# Patient Record
Sex: Female | Born: 1975 | Race: White | Hispanic: Yes | Smoking: Never smoker
Health system: Southern US, Community
[De-identification: ages and names within clinical notes are randomized; demographics above are authoritative.]

## PROBLEM LIST (undated history)

## (undated) DIAGNOSIS — Z789 Other specified health status: Secondary | ICD-10-CM

## (undated) HISTORY — PX: TUBAL LIGATION: SHX77

---

## 1998-02-09 ENCOUNTER — Inpatient Hospital Stay (HOSPITAL_COMMUNITY): Admission: AD | Admit: 1998-02-09 | Discharge: 1998-02-09 | Payer: Self-pay | Admitting: *Deleted

## 1998-04-22 ENCOUNTER — Other Ambulatory Visit: Admission: RE | Admit: 1998-04-22 | Discharge: 1998-04-22 | Payer: Self-pay | Admitting: Obstetrics and Gynecology

## 1998-09-22 ENCOUNTER — Inpatient Hospital Stay (HOSPITAL_COMMUNITY): Admission: AD | Admit: 1998-09-22 | Discharge: 1998-09-24 | Payer: Self-pay | Admitting: Obstetrics and Gynecology

## 2001-11-25 ENCOUNTER — Other Ambulatory Visit: Admission: RE | Admit: 2001-11-25 | Discharge: 2001-11-25 | Payer: Self-pay | Admitting: Obstetrics & Gynecology

## 2002-06-26 ENCOUNTER — Encounter: Payer: Self-pay | Admitting: *Deleted

## 2002-06-26 ENCOUNTER — Inpatient Hospital Stay (HOSPITAL_COMMUNITY): Admission: AD | Admit: 2002-06-26 | Discharge: 2002-06-26 | Payer: Self-pay | Admitting: *Deleted

## 2002-06-28 ENCOUNTER — Inpatient Hospital Stay (HOSPITAL_COMMUNITY): Admission: AD | Admit: 2002-06-28 | Discharge: 2002-07-01 | Payer: Self-pay | Admitting: Family Medicine

## 2002-07-03 ENCOUNTER — Inpatient Hospital Stay (HOSPITAL_COMMUNITY): Admission: AD | Admit: 2002-07-03 | Discharge: 2002-07-03 | Payer: Self-pay | Admitting: Family Medicine

## 2002-08-08 ENCOUNTER — Inpatient Hospital Stay (HOSPITAL_COMMUNITY): Admission: EM | Admit: 2002-08-08 | Discharge: 2002-08-13 | Payer: Self-pay | Admitting: Emergency Medicine

## 2002-08-19 ENCOUNTER — Encounter: Admission: RE | Admit: 2002-08-19 | Discharge: 2002-08-19 | Payer: Self-pay | Admitting: Internal Medicine

## 2004-03-20 ENCOUNTER — Inpatient Hospital Stay (HOSPITAL_COMMUNITY): Admission: AD | Admit: 2004-03-20 | Discharge: 2004-03-20 | Payer: Self-pay | Admitting: Obstetrics & Gynecology

## 2004-04-13 ENCOUNTER — Inpatient Hospital Stay (HOSPITAL_COMMUNITY): Admission: RE | Admit: 2004-04-13 | Discharge: 2004-04-16 | Payer: Self-pay | Admitting: Family Medicine

## 2004-04-20 ENCOUNTER — Inpatient Hospital Stay (HOSPITAL_COMMUNITY): Admission: AD | Admit: 2004-04-20 | Discharge: 2004-04-20 | Payer: Self-pay | Admitting: Obstetrics and Gynecology

## 2005-01-17 ENCOUNTER — Ambulatory Visit: Payer: Self-pay | Admitting: Internal Medicine

## 2005-02-01 ENCOUNTER — Ambulatory Visit: Payer: Self-pay | Admitting: Nurse Practitioner

## 2005-02-07 ENCOUNTER — Ambulatory Visit: Payer: Self-pay | Admitting: *Deleted

## 2005-02-20 ENCOUNTER — Ambulatory Visit: Payer: Self-pay | Admitting: Nurse Practitioner

## 2008-07-13 ENCOUNTER — Ambulatory Visit: Payer: Self-pay | Admitting: Family Medicine

## 2013-07-17 ENCOUNTER — Encounter: Payer: Self-pay | Admitting: *Deleted

## 2013-07-17 ENCOUNTER — Ambulatory Visit (INDEPENDENT_AMBULATORY_CARE_PROVIDER_SITE_OTHER): Payer: Self-pay | Admitting: *Deleted

## 2013-07-17 VITALS — BP 114/64 | Ht <= 58 in | Wt 115.0 lb

## 2013-07-17 DIAGNOSIS — Z3480 Encounter for supervision of other normal pregnancy, unspecified trimester: Secondary | ICD-10-CM

## 2013-07-17 DIAGNOSIS — Z3481 Encounter for supervision of other normal pregnancy, first trimester: Secondary | ICD-10-CM

## 2013-07-17 NOTE — Patient Instructions (Addendum)
Pregnancy If you are planning on getting pregnant, it is a good idea to make a preconception appointment with your caregiver to discuss having a healthy lifestyle before getting pregnant. This includes diet, weight, exercise, taking prenatal vitamins (especially folic acid, which helps prevent brain and spinal cord defects), avoiding alcohol, smoking and illegal drugs, medical problems (diabetes, convulsions), family history of genetic problems, working conditions, and immunizations. It is better to have knowledge of these things and do something about them before getting pregnant. During your pregnancy, it is important to follow certain guidelines in order to have a healthy baby. It is very important to get good prenatal care and follow your caregiver's instructions. Prenatal care includes all the medical care you receive before your baby's birth. This helps to prevent problems during the pregnancy and childbirth. HOME CARE INSTRUCTIONS   Start your prenatal visits by the 12th week of pregnancy or earlier, if possible. At first, appointments are usually scheduled monthly. They become more frequent in the last 2 months before delivery. It is important that you keep your caregiver's appointments and follow your caregiver's instructions regarding medication use, exercise, and diet.  During pregnancy, you are providing food for you and your baby. Eat a regular, well-balanced diet. Choose foods such as meat, fish, milk and other dairy products, vegetables, fruits, whole-grain breads and cereals. Your caregiver will inform you of the ideal weight gain depending on your current height and weight. Drink lots of liquids. Try to drink 8 glasses of water a day.  Alcohol is associated with a number of birth defects including fetal alcohol syndrome. It is best to avoid alcohol completely. Smoking will cause low birth rate and prematurity. Use of alcohol and nicotine during your pregnancy also increases the chances that  your child will be chemically dependent later in their life and may contribute to SIDS (Sudden Infant Death Syndrome).  Do not use illegal drugs.  Only take prescription or over-the-counter medications that are recommended by your caregiver. Other medications can cause genetic and physical problems in the baby.  Morning sickness can often be helped by keeping soda crackers at the bedside. Eat a few before getting up in the morning.  A sexual relationship may be continued until near the end of pregnancy if there are no other problems such as early (premature) leaking of amniotic fluid from the membranes, vaginal bleeding, painful intercourse or belly (abdominal) pain.  Exercise regularly. Check with your caregiver if you are unsure of the safety of some of your exercises.  Do not use hot tubs, steam rooms or saunas. These increase the risk of fainting and hurting yourself and the baby. Swimming is OK for exercise. Get plenty of rest, including afternoon naps when possible, especially in the third trimester.  Avoid toxic odors and chemicals.  Do not wear high heels. They may cause you to lose your balance and fall.  Do not lift over 5 pounds. If you do lift anything, lift with your legs and thighs, not your back.  Avoid long trips, especially in the third trimester.  If you have to travel out of the city or state, take a copy of your medical records with you. SEEK IMMEDIATE MEDICAL CARE IF:   You develop an unexplained oral temperature above 102 F (38.9 C), or as your caregiver suggests.  You have leaking of fluid from the vagina. If leaking membranes are suspected, take your temperature and inform your caregiver of this when you call.  There is vaginal spotting   or bleeding. Notify your caregiver of the amount and how many pads are used.  You continue to feel sick to your stomach (nauseous) and have no relief from remedies suggested, or you throw up (vomit) blood or coffee ground like  materials.  You develop upper abdominal pain.  You have round ligament discomfort in the lower abdominal area. This still must be evaluated by your caregiver.  You feel contractions of the uterus.  You do not feel the baby move, or there is less movement than before.  You have painful urination.  You have abnormal vaginal discharge.  You have persistent diarrhea.  You get a severe headache.  You have problems with your vision.  You develop muscle weakness.  You feel dizzy and faint.  You develop shortness of breath.  You develop chest pain.  You have back pain that travels down to your leg and feet.  You feel irregular or a very fast heartbeat.  You develop excessive weight gain in a short period of time (5 pounds in 3 to 5 days).  You are involved in a domestic violence situation. Document Released: 10/30/2005 Document Revised: 04/30/2012 Document Reviewed: 04/23/2009 [facility] Patient Information 2014 [place], [place]. Embarazo  Primer trimestre  (Pregnancy - First Trimester)  Durante el acto sexual, millones de espermatozoides entran en la vagina. Slo 1 espermatozoide penetra y fertiliza al vulo mientras se encuentra en la trompa de [place]. Una semana ms tarde, el vulo fertilizado se implanta en la pared del tero. Un embrin comienza a desarrollarse para ser un beb. A las 6 a 8 semanas se forman los ojos y la cara y los latidos del corazn se pueden ver en la ecografa. Al final de las 12 semanas (primer trimestre) todos los rganos del beb estn formados. Ahora que est embarazada, querr hacer todo lo que est a su alcance para tener un beb sano. Dos de las cosas ms importantes son: Winferd Humphrey buena atencin prenatal y seguir las indicaciones del profesional que la asiste. La atencin prenatal incluye toda la asistencia mdica que usted recibe antes del nacimiento del beb. Se lleva a cabo para prevenir y tratar problemas durante el [address] y [place]. EXAMENES  PRENATALES   Durante las visitas prenatales se [employer], la presin arterial y se solicitan anlisis de [place]. Esto se hace para asegurarse de que usted est sana y el embarazo progrese normalmente.  Una mujer embarazada debe aumentar de 25 a 35 libras durante el embarazo. Sin embargo, si usted tiene sobrepeso o [place], su mdico Administrator, Civil Service.  El podr hacerle preguntas y responder todas las que usted le haga.  Durante los exmenes prenatales se solicitan anlisis de [place], cultivos del [place], un [place] y otros anlisis necesarios. Estas pruebas se realizan para controlar su salud y la del beb. Se recomienda que se haga la prueba para el diagnstico del VIH, con su autorizacin. Este es el virus que causa el [place]. Estas pruebas se realizan porque existen medicamentos que podran administrarle para prevenir que el beb nazca con esta infeccin, si usted estuviera infectada y no lo supiera. Los [employer] de sangre tambin se Radiographer, therapeutic para Warehouse manager tipo de [place], si tuvo infecciones previas y Chief Operating Officer sus niveles en la sangre (hemoglobina).  Tener un recuento bajo de hemoglobina (anemia) es comn durante Firefighter. Para prevenirla, se administran hierro y vitaminas. En una etapa ms avanzada del embarazo, le indicarn exmenes de sangre para saber si tiene diabetes, junto con otros  anlisis, en caso de que [place].  Es necesario que se haga las pruebas para asegurarse de que usted y el beb estn bien. CAMBIOS DURANTE EL PRIMER TRIMESTRE  Su organismo atravesar numerosos cambios durante el [place]. Estos pueden variar de Neomia Dear persona a otra. Converse con el mdico acerca los cambios que usted nota y que la preocupan. Ellos son:   El perodo menstrual se detiene.  El vulo y los espermatozoides llevan los genes que determinan cmo seremos. Sus genes y los de su pareja forman el beb. Los genes del varn determinan si ser un nio o una nia.  La  circunferencia de la cintura va a ir aumentando y podr sentirse hinchada.  Puede tener Programme researcher, broadcasting/film/video (nuseas) y vmitos. Si no puede controlar los vmitos, consulte a su mdico.  Sus mamas comenzarn a agrandarse y sensibilizarse.  Los pezones pueden sobresalir ms y ser ms oscuros.  Tendr necesidad de orinar ms. El dolor al orinar puede significar que usted tiene una infeccin de la vejiga.  Se cansar con facilidad.  Prdida del apetito.  Sentir un fuerte deseo de consumir ciertos alimentos.  Al principio, usted puede ganar o perder un par de kilos.  Podr tener cambios emocionales de un da a otro (entusiasmo por estar embarazada o preocupacin por Firefighter y el beb).  Tendr sueos ms vvidos y extraos. INSTRUCCIONES PARA EL CUIDADO EN EL HOGAR   Es muy importante evitar el cigarrillo, el alcohol y los frmacos no recetados Academic librarian. Estas sustancias afectan la formacin y el desarrollo del beb. Evite los productos qumicos durante el embarazo para Games developer salud del beb.  Comience las consultas prenatales alrededor de la 12 semana de [place]. Generalmente se programan cada mes al principio y se hacen ms frecuentes en los 2 ltimos meses antes del parto. Cumpla con las citas de control. Siga las indicaciones del mdico con respecto al uso de [place], los anlisis y pruebas de [place], los ejercicios y Psychologist, forensic.  Durante el embarazo debe obtener nutrientes para usted y para su beb. Consuma alimentos balanceados. Elija alimentos como carne, pescado, [place] y otros productos lcteos descremados, vegetales, frutas, panes integrales y cereales. El [employer] informar cul es el aumento de peso ideal.  Las nuseas matinales pueden aliviarse si come algunas galletitas saladas en la cama. Coma dos galletitas antes de levantarse por la maana. Tambin puede comer galletitas sin sal.  Hacer 4 o 5 comidas pequeas en lugar de 3 comidas grandes por  da tambin puede [employer] nuseas y los vmitos.  Beber lquidos [place] comidas en lugar de tomarlos durante las comidas tambin puede ayudar a Optician, dispensing las nuseas y los vmitos.  Puede continuar teniendo [employer] durante todo el embarazo si no hay otros problemas. Los problemas pueden ser una prdida precoz (prematura) de lquido amnitico, sangrado vaginal, o dolor en el vientre (abdominal).  Realice [employer], si no tiene restricciones. Consulte con su mdico o terapeuta fsico si no est segura de algunos de sus ejercicios. El mayor aumento de peso se producir en los ltimos 2 trimestres del Psychiatrist. El ejercicio le ayudar a:  Engineering geologist.  Mantenerse en forma.  Prepararse para el parto.  La ayudar a perder el peso del embarazo despus de que nazca su beb.  Use un buen sostn o como los que se usan para hacer deportes para Paramedic la sensibilidad de las [place]. Tambin puede serle til si lo [place] mientras  duerme.  Consulte cuando puede comenzar con las clases de pre parto. Comience con las clases cuando estn disponibles.  No utilice la baera con agua caliente, baos turcos y saunas.   Colquese el cinturn de seguridad cuando conduzca. Este la proteger a usted y al beb en caso de accidente.  Evite comer carne cruda y el contacto con los utensilios y desperdicios de los gatos. Estos elementos contienen grmenes que pueden causar defectos de nacimiento en el beb.  El primer trimestre es un buen momento para visitar a su dentista y Software engineer. Es importante mantener los dientes limpios. Use un cepillo de dientes suave y cepllese con ms suavidad durante el [employer].  Pida ayuda si tienen necesidades financieras, teraputicas o nutricionales. El profesional podr ayudarla con respecto a estas necesidades, o derivarla a otros especialistas.  No tome medicamentos o hierbas excepto aquellos que Print production planner.  Informe a su mdico si sufre violencia familiar mental o fsica.  Haga una lista de nmeros de telfono de Associate Professor de la familia, los amigos, el hospital y los departamentos de polica y bomberos.  Escriba sus preguntas. Llvelas cuando concurra a su visita prenatal.  No se haga duchas vaginales.  No cruce las piernas.  Si usted tiene que estar parada por largos perodos de [place], gire los pies o de pequeos pasos en crculo.  Es posible que tenga ms secreciones vaginales que puedan requerir una toalla higinica. No use tampones o toallas higinicas perfumadas. EL CONSUMO DE MEDICAMENTOS Y FRMACOS DURANTE EL EMBARAZO   Tome las vitaminas para la etapa prenatal tal como se le indic. Las vitaminas deben contener un miligramo de cido flico. Guarde todas las vitaminas fuera del alcance de los nios. La ingestin de slo un par de vitaminas o tabletas que contengan hierro pueden ocasionar la [employer] en un beb o en un nio pequeo.  Evite el uso de [employer] Mutual [employer] Omaha, incluyendo hierbas, medicamentos de [place], sin receta o que no hayan sido sugeridos por su mdico. Slo tome medicamentos de venta libre o medicamentos recetados para Chief Technology Officer, Environmental health practitioner o fiebre como lo indique su mdico. No tome aspirina, ibuprofeno o naproxeno excepto que su mdico se lo indique.  Infrmele al profesional si consume medicamentos de hierbas.  El alcohol se relaciona con ciertos defectos congnitos. Incluye el sndrome de alcoholismo fetal. Debe evitar absolutamente el consumo de alcohol, en cualquier forma. El fumar causar baja tasa de natalidad y bebs prematuros.  Las drogas ilegales o de la calle son muy perjudiciales para el beb. Estn absolutamente prohibidas. Un beb que nace de [employer], ser adicto al nacer. Ese beb tendr los mismos sntomas de abstinencia que un adulto.  Informe a su mdico acerca de los medicamentos que ha tomado y el motivo por el que los  tom. SOLICITE ATENCIN MDICA SI:  Tiene preguntas o preocupaciones relacionadas con el embarazo. Es mejor que llame para formular las preguntas si no puede esperar hasta la prxima visita, que sentirse preocupada por ellas.  SOLICITE ATENCIN MDICA DE INMEDIATO SI:   La temperatura oral le sube a ms de 102 F (38.9 C) o lo que su mdico le indique.  Tiene una prdida de lquido por la vagina (canal de parto). Si sospecha una ruptura de las [place], tmese la temperatura y llame al profesional para informarlo sobre esto.  Observa unas pequeas manchas o una hemorragia vaginal. Notifique al profesional acerca de la cantidad y de cuntos apsitos  est utilizando.  Presenta un olor desagradable en la secrecin vaginal y observa un cambio en el color.  Contina con las nuseas y no obtiene alivio de los remedios indicados. Vomita sangre o algo similar a la borra del caf.  Pierde ms de 2 libras (1 Kg) en una semana.  Aumenta ms de 1 Kg en una semana y [address], las manos, los pies o las piernas hinchados.  Aumenta ms de 2,5 Kg en una semana (aunque no tenga las manos, pies, piernas o el rostro hinchados).  Ha estado expuesta a la rubola y no ha sufrido la enfermedad.  Ha estado expuesta a la quinta enfermedad o a la varicela.  Siente dolor en el vientre (abdominal). Las [employer] en el ligamento redondo son Neomia Dear causa benigna frecuente de dolor abdominal durante el embarazo. El profesional que la asiste deber evaluarla.  Presenta dolor de Renne Musca, diarrea, dolor al orinar o le falta la respiracin.  Se cae, se ve involucrada en un accidente automovilstico o sufre algn tipo de traumatismo.  En su hogar hay violencia mental o fsica. Document Released: 08/09/2005 Document Revised: 07/24/2012 [facility] Patient Information 2014 [place], [place].

## 2013-07-18 LAB — OBSTETRIC PANEL
Antibody Screen: NEGATIVE
Basophils Absolute: 0 10*3/uL (ref 0.0–0.1)
Basophils Relative: 0 % (ref 0–1)
Eosinophils Absolute: 0.1 10*3/uL (ref 0.0–0.7)
Eosinophils Relative: 1 % (ref 0–5)
HCT: 36.6 % (ref 36.0–46.0)
Hemoglobin: 12.8 g/dL (ref 12.0–15.0)
Hepatitis B Surface Ag: NEGATIVE
Lymphocytes Relative: 21 % (ref 12–46)
Lymphs Abs: 2 10*3/uL (ref 0.7–4.0)
MCH: 31.8 pg (ref 26.0–34.0)
MCHC: 35 g/dL (ref 30.0–36.0)
Monocytes Absolute: 0.5 10*3/uL (ref 0.1–1.0)
Monocytes Relative: 6 % (ref 3–12)
Neutro Abs: 6.9 10*3/uL (ref 1.7–7.7)
Neutrophils Relative %: 72 % (ref 43–77)
Platelets: 220 10*3/uL (ref 150–400)
RBC: 4.03 MIL/uL (ref 3.87–5.11)
RDW: 13.6 % (ref 11.5–15.5)
Rh Type: POSITIVE
Rubella: 2.88 Index — ABNORMAL HIGH (ref <0.90)
WBC: 9.5 10*3/uL (ref 4.0–10.5)

## 2013-07-18 LAB — CULTURE, OB URINE
Colony Count: NO GROWTH
Organism ID, Bacteria: NO GROWTH

## 2013-07-18 LAB — HIV ANTIBODY (ROUTINE TESTING W REFLEX): HIV: NONREACTIVE

## 2013-07-29 ENCOUNTER — Encounter: Payer: Self-pay | Admitting: Obstetrics & Gynecology

## 2013-07-29 ENCOUNTER — Ambulatory Visit (INDEPENDENT_AMBULATORY_CARE_PROVIDER_SITE_OTHER): Payer: Self-pay | Admitting: Obstetrics & Gynecology

## 2013-07-29 VITALS — BP 109/65 | Wt 113.0 lb

## 2013-07-29 DIAGNOSIS — O09529 Supervision of elderly multigravida, unspecified trimester: Secondary | ICD-10-CM

## 2013-07-29 DIAGNOSIS — Z3491 Encounter for supervision of normal pregnancy, unspecified, first trimester: Secondary | ICD-10-CM

## 2013-07-29 DIAGNOSIS — IMO0002 Reserved for concepts with insufficient information to code with codable children: Secondary | ICD-10-CM | POA: Insufficient documentation

## 2013-07-29 DIAGNOSIS — Z3490 Encounter for supervision of normal pregnancy, unspecified, unspecified trimester: Secondary | ICD-10-CM | POA: Insufficient documentation

## 2013-07-29 DIAGNOSIS — Z124 Encounter for screening for malignant neoplasm of cervix: Secondary | ICD-10-CM

## 2013-07-29 DIAGNOSIS — Z3480 Encounter for supervision of other normal pregnancy, unspecified trimester: Secondary | ICD-10-CM

## 2013-07-29 DIAGNOSIS — O34219 Maternal care for unspecified type scar from previous cesarean delivery: Secondary | ICD-10-CM

## 2013-07-29 DIAGNOSIS — Z789 Other specified health status: Secondary | ICD-10-CM

## 2013-07-29 DIAGNOSIS — Z113 Encounter for screening for infections with a predominantly sexual mode of transmission: Secondary | ICD-10-CM

## 2013-07-29 DIAGNOSIS — O09521 Supervision of elderly multigravida, first trimester: Secondary | ICD-10-CM

## 2013-07-29 DIAGNOSIS — Z302 Encounter for sterilization: Secondary | ICD-10-CM | POA: Insufficient documentation

## 2013-07-29 DIAGNOSIS — Z609 Problem related to social environment, unspecified: Secondary | ICD-10-CM

## 2013-07-29 DIAGNOSIS — Z1151 Encounter for screening for human papillomavirus (HPV): Secondary | ICD-10-CM

## 2013-07-29 NOTE — Progress Notes (Signed)
[id number] 

## 2013-07-29 NOTE — Patient Instructions (Signed)
Embarazo  Primer trimestre  (Pregnancy - First Trimester)  Durante el acto sexual, millones de espermatozoides entran en la vagina. Slo 1 espermatozoide penetra y fertiliza al vulo mientras se encuentra en la trompa de [place]. Una semana ms tarde, el vulo fertilizado se implanta en la pared del tero. Un embrin comienza a desarrollarse para ser un beb. A las 6 a 8 semanas se forman los ojos y la cara y los latidos del corazn se pueden ver en la ecografa. Al final de las 12 semanas (primer trimestre) todos los rganos del beb estn formados. Ahora que est embarazada, querr hacer todo lo que est a su alcance para tener un beb sano. Dos de las cosas ms importantes son: Lucilla Edin buena atencin prenatal y seguir las indicaciones del profesional que la asiste. La atencin prenatal incluye toda la asistencia mdica que usted recibe antes del nacimiento del beb. Se lleva a cabo para prevenir y tratar problemas durante el embarazo y [place]. [place] visitas prenatales se ONEOK, la presin arterial y se solicitan anlisis de [place]. Esto se hace para asegurarse de que usted est sana y el embarazo progrese normalmente.  Una mujer Adelino 25 a [place]. Sin embargo, si usted tiene sobrepeso o [place], su mdico Chartered certified accountant.  El podr hacerle preguntas y responder todas las que usted le haga.  Durante los exmenes prenatales se solicitan anlisis de [place], cultivos del [place], un [place] y otros anlisis necesarios. Estas pruebas se realizan para controlar su salud y la del beb. Se recomienda que se haga la prueba para el diagnstico del VIH, con su autorizacin. Este es el virus que causa el [place]. Estas pruebas se realizan porque existen medicamentos que podran administrarle para prevenir que el beb nazca con esta infeccin, si usted estuviera infectada y no lo supiera. Los [employer] de sangre  tambin se Insurance underwriter para Actor tipo de [place], si tuvo infecciones previas y Chief Technology Officer sus niveles en la sangre (hemoglobina).  Tener un recuento bajo de hemoglobina (anemia) es comn durante Water quality scientist. Para prevenirla, se administran hierro y vitaminas. En una etapa ms avanzada del [place], le indicarn exmenes de sangre para saber si tiene diabetes, junto con otros anlisis, en caso de que [place].  Es necesario que se haga las pruebas para asegurarse de que usted y el beb estn bien. CAMBIOS DURANTE EL PRIMER TRIMESTRE  Su organismo atravesar numerosos cambios durante el [place]. Estos pueden variar de Ardelia Mems persona a otra. Converse con el mdico acerca los cambios que usted nota y que la preocupan. Ellos son:   El perodo menstrual se detiene.  El vulo y los espermatozoides llevan los genes que determinan cmo seremos. Sus genes y los de su pareja forman el beb. Los genes del varn determinan si ser un nio o una nia.  La circunferencia de la cintura va a ir aumentando y podr sentirse hinchada.  Puede tener Higher education careers adviser (nuseas) y vmitos. Si no puede controlar los vmitos, consulte a su mdico.  Sus mamas comenzarn a agrandarse y sensibilizarse.  Los pezones pueden sobresalir ms y ser ms oscuros.  Tendr necesidad de orinar ms. El dolor al orinar puede significar que usted tiene una infeccin de la vejiga.  Se cansar con facilidad.  Prdida del apetito.  Sentir un fuerte deseo de consumir ciertos alimentos.  Al principio, usted puede ganar o perder un par de kilos.  Podr tener cambios emocionales de un da a otro (entusiasmo por estar embarazada o preocupacin por Water quality scientist y el beb).  Tendr sueos ms vvidos y extraos. INSTRUCCIONES PARA EL CUIDADO EN EL HOGAR   Es muy importante evitar el cigarrillo, el alcohol y los frmacos no recetados Solicitor. Estas sustancias afectan la formacin y el desarrollo del beb.  Evite los productos qumicos durante el embarazo para Tourist information centre manager salud del beb.  Comience las consultas prenatales alrededor de la 12 semana de [place]. Generalmente se programan cada mes al principio y se hacen ms frecuentes en los 2 ltimos meses antes del parto. Cumpla con las citas de control. Siga las indicaciones del mdico con respecto al uso de [place], los anlisis y pruebas de [place], los ejercicios y Engineer, technical sales.  Durante el embarazo debe obtener nutrientes para usted y para su beb. Consuma alimentos balanceados. Elija alimentos como carne, pescado, [place] y otros productos lcteos descremados, vegetales, frutas, panes integrales y cereales. El V[employer]om informar cul es el aumento de peso ideal.  Las nuseas matinales pueden aliviarse si come algunas galletitas saladas en la cama. Coma dos galletitas antes de levantarse por la maana. Tambin puede comer galletitas sin sal.  Hacer 4 o 5 comidas pequeas en lugar de 3 comidas grandes por da tambin puede [employer] nuseas y los vmitos.  Beber lquidos [employer] comidas en lugar de tomarlos durante las comidas tambin puede ayudar a Actor las nuseas y los vmitos.  Puede continuar teniendo [employer] durante todo el embarazo si no hay otros problemas. Los problemas pueden ser una prdida precoz (prematura) de lquido amnitico, sangrado vaginal, o dolor en el vientre (abdominal).  [place], si no tiene restricciones. Consulte con su mdico o terapeuta fsico si no est segura de algunos de sus ejercicios. El mayor aumento de peso se producir en los ltimos 2 trimestres del Media planner. El ejercicio le ayudar a:  Engineer, technical sales.  Mantenerse en forma.  Prepararse para el parto.  La ayudar a perder el peso del embarazo despus de que nazca su beb.  Use un buen sostn o como los que se usan para hacer deportes para Public house manager la sensibilidad de las [place]. Tambin puede serle  til si lo [place] mientras duerme.  Consulte cuando puede comenzar con las clases de pre parto. Comience con las clases cuando estn disponibles.  No utilice la baera con agua caliente, baos turcos y saunas.   Colquese el cinturn de seguridad cuando conduzca. Este la proteger a usted y al beb en caso de accidente.  Evite comer carne cruda y el contacto con los utensilios y desperdicios de los gatos. Estos elementos contienen grmenes que pueden causar defectos de nacimiento en el beb.  El primer trimestre es un buen momento para visitar a su dentista y Leisure centre manager. Es importante mantener los dientes limpios. Use un cepillo de dientes suave y cepllese con ms suavidad durante el [employer].  Pida ayuda si tienen necesidades financieras, teraputicas o nutricionales. El profesional podr ayudarla con respecto a estas necesidades, o derivarla a otros especialistas.  No tome medicamentos o hierbas excepto aquellos que Firefighter.  Informe a su mdico si sufre violencia familiar mental o fsica.  Haga una lista de nmeros de telfono de Freight forwarder de la familia, los amigos, el hospital y los departamentos de polica y bomberos.  Escriba sus preguntas. Llvelas cuando concurra a su visita prenatal.  No se  haga duchas vaginales.  No cruce las piernas.  Si usted tiene que estar parada por largos perodos de [place], gire los pies o de pequeos pasos en crculo.  Es posible que tenga ms secreciones vaginales que puedan requerir una toalla higinica. No use tampones o toallas higinicas perfumadas. EL CONSUMO DE MEDICAMENTOS Y FRMACOS DURANTE EL EMBARAZO   Tome las vitaminas para la etapa prenatal tal como se le indic. Las vitaminas deben contener un miligramo de cido flico. Guarde todas las vitaminas fuera del alcance de los nios. La ingestin de slo un par de vitaminas o tabletas que contengan hierro pueden ocasionar la [employer] en un beb o en un nio  pequeo.  Evite el uso de [employer] Mutual [employer] Omaha, incluyendo hierbas, medicamentos de [place], sin receta o que no hayan sido sugeridos por su mdico. Slo tome medicamentos de venta libre o medicamentos recetados para Chief Technology Officer, Environmental health practitioner o fiebre como lo indique su mdico. No tome aspirina, ibuprofeno o naproxeno excepto que su mdico se lo indique.  Infrmele al profesional si consume medicamentos de hierbas.  El alcohol se relaciona con ciertos defectos congnitos. Incluye el sndrome de alcoholismo fetal. Debe evitar absolutamente el consumo de alcohol, en cualquier forma. El fumar causar baja tasa de natalidad y bebs prematuros.  Las drogas ilegales o de la calle son muy perjudiciales para el beb. Estn absolutamente prohibidas. Un beb que nace de [employer], ser adicto al nacer. Ese beb tendr los mismos sntomas de abstinencia que un adulto.  Informe a su mdico acerca de los medicamentos que ha tomado y el motivo por el que los tom. SOLICITE ATENCIN MDICA SI:  Tiene preguntas o preocupaciones relacionadas con el embarazo. Es mejor que llame para formular las preguntas si no puede esperar hasta la prxima visita, que sentirse preocupada por ellas.  SOLICITE ATENCIN MDICA DE INMEDIATO SI:   La temperatura oral le sube a ms de 102 F (38.9 C) o lo que su mdico le indique.  Tiene una prdida de lquido por la vagina (canal de parto). Si sospecha una ruptura de las [place], tmese la temperatura y llame al profesional para informarlo sobre esto.  Observa unas pequeas manchas o una hemorragia vaginal. Notifique al profesional acerca de la cantidad y de cuntos apsitos est utilizando.  Presenta un olor desagradable en la secrecin vaginal y observa un cambio en el color.  Contina con las nuseas y no obtiene alivio de los remedios indicados. Vomita sangre o algo similar a la borra del caf.  Pierde ms de 2 libras (1 Kg) en una semana.  Aumenta ms de 1 Kg  en una semana y [address], las manos, los pies o las piernas hinchados.  Aumenta ms de 2,5 Kg en una semana (aunque no tenga las manos, pies, piernas o el rostro hinchados).  Ha estado expuesta a la rubola y no ha sufrido la enfermedad.  Ha estado expuesta a la quinta enfermedad o a la varicela.  Siente dolor en el vientre (abdominal). Las [employer] en el ligamento redondo son Neomia Dear causa benigna frecuente de dolor abdominal durante el embarazo. El profesional que la asiste deber evaluarla.  Presenta dolor de Renne Musca, diarrea, dolor al orinar o le falta la respiracin.  Se cae, se ve involucrada en un accidente automovilstico o sufre algn tipo de traumatismo.  En su hogar hay violencia mental o fsica. Document Released: 08/09/2005 Document Revised: 07/24/2012 [facility] Patient Information 2014 [place], [place].

## 2013-07-29 NOTE — Progress Notes (Signed)
  Subjective:    Kara Hudson is a [id number] at [redacted]w[redacted]d by LMP being seen today for her first obstetrical visit.  Her obstetrical history is significant for advanced maternal age and cesarean section x 2. Patient does intend to breast feed. Pregnancy history fully reviewed.  Patient reports no complaints. She is an [facility] patient. Patient is Spanish-speaking only, Spanish interpreter present for this encounter.   Filed Vitals:   07/29/13 1430  BP: 109/65  Weight: 113 lb (51.256 kg)    HISTORY: OB History  Gravida Para Term Preterm AB SAB TAB Ectopic Multiple Living  4 3 3  0 0 0 0 0 0 3    # Outcome Date GA Lbr Len/2nd Weight Sex Delivery Anes PTL Lv  4 CUR           3 TRM 2005 [redacted]w[redacted]d  7 lb (3.175 kg) M LTCS EPI N Y     Comments: breech c-section  2 TRM 2003 [redacted]w[redacted]d  7 lb (3.175 kg) M LTCS EPI N Y     Comments: Tried vaginal but did not progess so had c section  1 TRM 1999 [redacted]w[redacted]d  6 lb 6 oz (2.892 kg) M SVD EPI N Y     No past medical history on file. Past Surgical History  Procedure Laterality Date  . Cesarean section     Family History  Problem Relation Age of Onset  . Hypertension Mother   . Hypertension Son      Exam    Uterus:     Pelvic Exam:    Perineum: No Hemorrhoids, normal perineum   Vulva: normal   Vagina:  normal mucosa, normal discharge   Cervix: no bleeding following Pap   Adnexa: normal adnexa and no mass, fullness, tenderness   Bony Pelvis: gynecoid  System: Breast:  normal appearance, no masses or tenderness   Skin: normal coloration and turgor, no rashes   Neurologic: normal   Extremities: normal strength, tone, and muscle mass   HEENT PERRLA   Mouth/Teeth mucous membranes moist, pharynx normal without lesions and dental hygiene good   Neck supple and no masses   Cardiovascular: regular rate and rhythm   Respiratory:  appears well, vitals normal, no respiratory distress, acyanotic, normal RR, chest clear, no wheezing, crepitations, rhonchi,  normal symmetric air entry   Abdomen: soft, non-tender; bowel sounds normal; no masses,  no organomegaly   Urinary: urethral meatus normal   Bedside ultrasound: CRL gives GA of [redacted]w[redacted]d consistent with LMP, +FHR   Assessment:    Pregnancy: [id number] Patient Active Problem List   Diagnosis Date Noted  . Supervision of normal pregnancy 07/29/2013  . Advanced maternal age, antepartum 07/29/2013  . Previous cesarean section x 2, antepartum  07/29/2013  . Language barrier, speaks Spanish only 07/29/2013  . Desires Sterilization 07/29/2013   Plan:   Initial labs reviewed and are normal Continue prenatal vitamins. Problem list reviewed and updated. Patient desires repeat cesarean section and BTS Genetic Screening discussed, emphasized increased risk of anomalies due to AMA, patient declines due to cost. Ultrasound discussed; fetal survey: will be ordered at Dr. Elsie Stain office. Follow up in 4 weeks.  Jaynie Collins, MD, FACOG Attending Obstetrician & Gynecologist Faculty Practice, [facility] [place]

## 2013-08-19 ENCOUNTER — Ambulatory Visit (INDEPENDENT_AMBULATORY_CARE_PROVIDER_SITE_OTHER): Payer: Self-pay | Admitting: Obstetrics & Gynecology

## 2013-08-19 ENCOUNTER — Encounter: Payer: Self-pay | Admitting: Obstetrics & Gynecology

## 2013-08-19 VITALS — BP 113/74 | Wt 114.0 lb

## 2013-08-19 DIAGNOSIS — O09529 Supervision of elderly multigravida, unspecified trimester: Secondary | ICD-10-CM

## 2013-08-19 DIAGNOSIS — Z3490 Encounter for supervision of normal pregnancy, unspecified, unspecified trimester: Secondary | ICD-10-CM

## 2013-08-19 DIAGNOSIS — Z3480 Encounter for supervision of other normal pregnancy, unspecified trimester: Secondary | ICD-10-CM

## 2013-08-19 NOTE — Progress Notes (Signed)
Routine visit. No problems. [facility] She declines to have it here today. She again declines genetic testing.

## 2013-08-19 NOTE — Progress Notes (Signed)
[id number] 

## 2013-09-15 ENCOUNTER — Encounter: Payer: Self-pay | Admitting: Family Medicine

## 2013-09-15 ENCOUNTER — Ambulatory Visit (INDEPENDENT_AMBULATORY_CARE_PROVIDER_SITE_OTHER): Payer: Self-pay | Admitting: Family Medicine

## 2013-09-15 VITALS — BP 118/63 | Wt 119.0 lb

## 2013-09-15 DIAGNOSIS — Z3492 Encounter for supervision of normal pregnancy, unspecified, second trimester: Secondary | ICD-10-CM

## 2013-09-15 DIAGNOSIS — Z3480 Encounter for supervision of other normal pregnancy, unspecified trimester: Secondary | ICD-10-CM

## 2013-09-15 NOTE — Progress Notes (Signed)
Ordered anatomy u/s with Dr. Gaynell Face. Concerned about one breast larger than another in breast--but no masses noted.  Retracted nipple on left--not new and easily brought out. Recommend flu shot Interested in TOLAC with 1 prev. SVD--discussed risks. Information given.

## 2013-09-15 NOTE — Patient Instructions (Addendum)
Lactancia materna  (Breastfeeding)  El cambio hormonal durante el Psychiatrist produce el desarrollo del tejido [place] y un aumento en el nmero y tamao de los conductos galactforos. La hormona prolactina permite que las protenas, los azcares y las grasas de la sangre produzcan la [employer] materna en las glndulas productoras de [place]. La hormona progesterona impide que la leche materna sea liberada antes del nacimiento del beb. Despus del nacimiento del beb, su nivel de progesterona disminuye permitiendo que la leche materna sea [place]. Pensar en el beb, as como la succin o Theatre manager, pueden estimular la liberacin de [place] de las glndulas productoras de [place].  La decisin de Company secretary) es una de las mejores opciones que usted puede hacer para usted y su beb. La informacin que sigue da una breve resea de los beneficios, as Lexicographer que debe saber sobre la [place].  LOS BENEFICIOS DE AMAMANTAR  Para el beb   La primera leche (calostro) ayuda al mejor funcionamiento del sistema digestivo del beb.   La leche tiene anticuerpos que provienen de la madre y que ayudan a prevenir las infecciones en el beb.   El beb tiene una menor incidencia de asma, alergias y del sndrome de muerte sbita del lactante (SMSL).   Los nutrientes de la [place] materna son mejores para el beb que la [employer].  La leche materna mejora el desarrollo cerebral del beb.   Su beb tendr menos gases, clicos y estreimiento.  Es menos probable que el beb desarrolle otras enfermedades, como obesidad infantil, asma o diabetes mellitus. Para usted   La lactancia materna favorece el desarrollo de un vnculo muy especial entre la madre y el beb.   Es ms conveniente, siempre disponible, a la [place] y [place].   La lactancia materna ayuda a quemar caloras y a perder el peso ganado durante el [place].   Hace que el tero se  contraiga ms rpidamente a su tamao normal y [employer] sangrado despus del [place].   Las [employer] que amamantan tienen menos riesgo de Environmental education officer osteoporosis o cncer de mama o de ovario en el futuro.  FRECUENCIA DEL AMAMANTAMIENTO   Un beb sano, nacido a trmino, puede amamantarse con tanta frecuencia como cada hora, o espaciar las comidas cada tres horas. La frecuencia en la lactancia varan de un beb a otro.   Los recin nacidos deben ser alimentados por lo menos cada 2-3 horas Administrator y cada 4-5 horas durante la noche. Usted debe amamantarlo un mnimo de 8 tomas en un perodo de 24 horas.  Despierte al beb para amamantarlo si han pasado 3-4 horas desde la ltima comida.  Amamante cuando sienta la necesidad de reducir la plenitud de sus senos o cuando el beb muestre signos de [place]. Las seales de que el beb puede Gentry Fitz son:  Lenora Boys su estado de alerta o vigilancia.  Se estira.  Mueve la cabeza de un lado a otro.  Mueve la cabeza y abre la boca cuando se le toca la mejilla o la boca (reflejo de succin).  Aumenta las vocalizaciones, tales como sonidos de succin, relamerse los labios, arrullos, suspiros, o chirridos.  Mueve la [employer] boca.  Se chupa con ganas los dedos o las manos.  Agitacin.  Llanto intermitente.  Los signos de hambre extrema requerirn que lo calme y lo consuele antes de tratar de alimentarlo. Los signos de hambre extrema son:  Agitacin.  Llanto fuerte e intenso.  Gritos.  El amamantamiento frecuente la ayudar a producir ms [place] y a Education officer, community de Engineer, mining en los pezones e hinchazn de las [place].  LACTANCIA MATERNA   Ya sea que se encuentre acostada o sentada, asegrese que el abdomen del beb est enfrente el suyo.   Sostenga la mama con el pulgar por arriba y los otros 4 dedos por debajo del pezn. Asegrese que sus dedos se encuentren lejos del pezn y de la boca del beb.   Empuje suavemente los  labios del beb con el pezn o con el dedo.   Cuando la boca del beb se abra lo suficiente, introduzca el pezn y la zona oscura que lo rodea (areola) tanto como le sea posible dentro de la boca.  Debe haber ms areola visible por arriba del labio superior que por debajo del labio inferior.  La lengua del beb debe estar entre la enca inferior y el seno.  Asegrese de que la boca del beb est en la posicin correcta alrededor del pezn (prendida). Los labios del beb deben crear un sello sobre su pecho.  Las seales de que el beb se ha prendido eficazmente al pezn son:  Payton Doughty o succiona sin dolor.  Se escucha que traga [employer].  No hace ruidos ni chasquidos.  Hay movimientos musculares por arriba y por delante de sus odos al Printmaker.  El beb debe succionar unos 2-3 minutos para que salga la [place]. Permita que el nio se alimente en cada mama todo lo que desee. Alimente al beb hasta que se desprenda o se quede dormido en Freight forwarder y luego ofrzcale el segundo pecho.  Las seales de que el beb est lleno y satisfecho son:  Disminuye gradualmente el nmero de succiones o no succiona.  Se queda dormido.  Extiende o relaja su cuerpo.  Retiene una pequea cantidad de [facility] boca.  Se desprende del pecho por s mismo.  Los signos de una lactancia materna eficaz son:  Los senos han aumentado la firmeza, el peso y el tamao antes de la alimentacin.  Son ms blandos despus de amamantar.  Un aumento del volumen de [place], y tambin el cambio de su consistencia y color se producen hacia el quinto da de Tour manager.  La congestin mamaria se [place] al dar de [place].  Los pezones no duelen, ni estn agrietados ni sangran.  De ser necesario, interrumpa la succin poniendo su dedo en la esquina de la boca del beb y deslizando el dedo entre sus encas. A continuacin, retire la mama de su boca.  Es comn que los bebs regurgiten un poco  despus de comer.  A menudo los bebs tragan aire al alimentarse. Esto puede hacer que se sienta molesto. Hacer eructar al beb al [place] de pecho puede ser de [place].  Se recomiendan suplementos de vitamina D para los bebs que reciben slo [address].  Evite el uso del chupete durante las primeras 4 a 6 semanas de vida.  Evite la alimentacin suplementaria con agua, frmula o jugo en lugar de la [employer]. La leche materna es todo el alimento que el beb necesita. No es necesario que el nio ingiera agua o preparados de bibern. Sus pechos producirn ms leche si se evita la alimentacin suplementaria durante las primeras semanas. COMO SABER SI EL BEB OBTIENE LA SUFICIENTE LECHE MATERNA  Preguntarse si el beb obtiene la cantidad suficiente de [place] es una preocupacin frecuente [employer]. Puede asegurarse que el  beb tiene la leche suficiente si:   El beb succiona activamente y usted escucha que traga.   El beb parece estar relajado y satisfecho despus de Psychologist, clinical.   El nio se alimenta al menos 8 a 12 veces en 24 horas.  Durante los primeros 3 a 5 das de vida:  Moja 3-5 paales en 24 horas. La materia fecal debe ser blanda y [place].  Tiene al menos 3 a 4 deposiciones en 24 horas. La materia fecal debe ser blanda y [place].  A los 5-7 das de vida, el beb debe tener al menos 3-6 deposiciones en 24 horas. La materia fecal debe ser grumosa y [place] a los [address] de [place].  Su beb tiene una prdida de Psychologist, counselling a 7al 10% durante los primeros 3 [address]  [address] de [address]  El beb no pierde peso despus de 3-7 [address] de [address]  El beb debe aumentar 4 a 6 libras (120 a 170 gr.) por semana despus de los 4 [address] de vida.  Aumenta de [place] a los [address] de vida y vuelve al peso del nacimiento dentro de las 2 semanas. CONGESTIN MAMARIA  Durante la primera semana despus del [place], usted puede experimentar hinchazn en las mamas (congestin [place]). Al estar congestionadas,  las mamas se sienten pesadas, calientes o sensibles al tacto. El pico de la congestin ocurre a las 24 -48 horas despus del parto.   La congestin puede disminuirse:  Continuando con la Tour manager.  Aumentando la frecuencia.  Tomando duchas calientes o aplicando calor hmedo en los senos antes de cada comida. Esto aumenta la circulacin y [place] a que la [place].   Masajeando suavemente el pecho antes y [employer]. Con las yemas de los dedos, masajee la pared del pecho hacia el pezn en un movimiento circular.   Asegurarse de que el beb vaca al menos uno de sus pechos en cada alimentacin. Tambin ayuda si comienza la siguiente toma en el otro seno.   Extraiga manualmente o con un sacaleches las mamas para vaciar los pechos si el beb tiene sueo o no se aliment bien. Tambin puede extraer la [employer] cuando vuelva a trabajar o si siente que se estn congestionando las [place].  Asegrese de que el beb se prende y est bien colocado durante la Market researcher. Si sigue estas indicaciones, la congestin debe mejorar en 24 a 48 horas. Si an tiene dificultades, consulte a Barista.  CUDESE USTED MISMA  Cuide sus mamas.   Bese o dchese diariamente.   Evite usar [employer].   Use un sostn de soporte Evite el uso de sostenes con aro.  Seque al aire sus pezones durante 3-4 minutos despus de cada comida.   Utilice slo apsitos de algodn en el sostn para absorber las prdidas de [place]. La prdida de un poco de [employer] las comidas es normal.   Use solamente lanolina pura en sus pezones despus de Museum/gallery exhibitions officer. Usted no tiene que lavarla antes de alimentar al beb. Otra opcin es sacarse unas gotas de [place] y [employer] pezones.  Continuar con los autocontroles de la mama. Cudese.   Consuma alimentos saludables. Alterne 3 comidas con 3 colaciones.  Evite los alimentos que usted nota que perjudican al beb.  Dixie Dials, jugos de fruta y agua para Patent examiner su sed (aproximadamente 8 vasos al Futures trader).   Descanse con frecuencia, reljese y tome sus vitaminas prenatales para evitar la fatiga, el estrs y  la anemia.  Evite masticar y fumar tabaco.  Evite el consumo de alcohol y drogas.  Tome medicamentos de venta libre y recetados tal como le indic su mdico o Social research officer, government. Siempre debe consultar con su mdico o farmacutico antes de tomar cualquier medicamento, vitamina o suplemento de hierbas.  Sepa que durante la lactancia puede quedar embarazada. Si lo desea, hable con su mdico acerca de la planificacin familiar y los mtodos anticonceptivos seguros que puede utilizar durante la Market researcher. SOLICITE ATENCIN MDICA SI:   Usted siente que quiere dejar de Museum/gallery exhibitions officer o se siente frustrada con la lactancia.  Siente dolor en los senos o en los pezones.  Sus pezones estn agrietados o Water quality scientist.  Sus pechos estn irritados, sensibles o calientes.  Tiene un rea hinchada en cualquiera de los senos.  Siente escalofros o fiebre.  Tiene nuseas o vmitos.  Observa un drenaje en los pezones.  Sus mamas no se llenan antes de Marine scientist al 5to da despus del [place].  Se siente triste y deprimida.  El nio est demasiado somnoliento como para comer.  El nio tiene problemas para Industrial/product designer.   Moja menos de 3 paales en 24 horas.  Mueve el intestino menos de 3 veces en 24 horas.  La piel del beb o la parte blanca de sus ojos est ms amarilla.   El beb no ha aumentado de [place] a los [address] de [place]. ASEGRESE DE QUE:   Comprende estas instrucciones.  Controlar su enfermedad.  Solicitar ayuda de inmediato si no mejora o si empeora. Document Released: 10/30/2005 Document Revised: 07/24/2012 ExitCare Patient Information 2014 ExitCare, [place]. (Vaginal Birth After Cesarean Delivery) Un parto vaginal luego de un parto por cesrea es dar a luz por la vagina luego de haber dado a luz por medio  de una intervencin [place]. En el pasado, si una mujer tena un beb por cesrea, todos los partos posteriores deban hacerse por cesrea. Esto ya no es as. Puede ser seguro para la mam intentar un parto vaginal luego de una cesrea. La decisin final de tener un parto vaginal o por cesrea debe tomarse en conjunto, entre la paciente y el mdico. [place] riesgos y los beneficios debern evaluarse con relacin a los motivos y al tipo de cesrea previa LAS MUJERES QUE QUIEREN TENER UN PARTO VAGINAL, DEBEN CONSULTAR CON SU MDICO PARA ASEGURARSE QUE:  La cesrea anterior se realiz con una incisin uterina transversal (no con una incisin vertical clsica).  El canal de parto es lo suficientemente grande como para que pase el [place].  No ha sido sometida a otras operaciones del tero.  Durante el trabajo de parto le realizarn un monitoreo electrnico fetal, en todo momento.  Es necesario que haya un quirfano disponible y listo en caso de necesitar una cesrea de emergencia.  Un cirujano y personal de quirfano estarn disponibles en todo momento durante el [place] de parto, para realizar una cesrea en caso de ser necesario.  Habr un anestesista disponible en caso de necesitar una cesrea de emergencia.  La nursery est lista cuenta con personal especializado y el equipo disponible para cuidar al beb en caso de emergencia. BENEFICIOS  Permanencia ms breve en el hospital.  Menores costos en el parto, la nurse y el hospital.  Menos prdida de sangre y menos probabilidad de necesitar una transfusin sangunea.  Menos probabilidad de tener fiebre o molestias como consecuencia de una ciruga mayor  Menos riesgo de cogulos sanguneos.  Menos riesgo de sufrir infecciones.  Recuperacin ms  rpida luego del alta mdica.  Menos riesgo de complicaciones quirrgicas, como apertura o hernia de la incisin.  Disminucin del riesgo de lesiones a otros rganos  Menor riesgo de remocin del  tero (histerectoma)  Menor riesgo de que la placenta cubra parcial o completamente la abertura del tero (placenta previa) en embarazos futuros  Posibilidad de tener una familia grande, si lo desea. RIESGOS  Ruptura del tero.  Si el tero se rompe Production manager.  Todas las complicaciones de [place] mayor y lesiones en otros rganos.  Hemorragia excesiva, cogulos e infeccin.  [place] Puntuacin Apgar baja (mtodo que evala al recin nacido segn su apariencia, pulso, muecas, actividad y respiracion) y ms riesgos para el beb.  Hay mas riesgo de ruptura del tero si se induce o aumenta el trabajo de [place].  Hay un mayor riesgo de ruptura uterina si se usan medicamentos para madurar el cuello. NO DEBE LLEVARSE A CABO SI:  La cesrea previa se realiz con una incicin vertical (clsica) o con forma de T, o usted no sabe cul de [employer] han practicado.  Ha sufrido ruptura del tero.  Le han practicado una ciruga de tero.  Tiene problemas mdicos u obsttricos.  El beb est en problemas.  Tuvo dos cesreas previas y ningn parto vaginal. OTRAS COSAS QUE DEBE SABER:  La anestesia peridural es segura.  Es seguro dar vuelta al beb si se encuentra de nalgas (intentar una versin ceflica externa).  Es seguro intentarlo en caso de mellizos.  Los embarazos de ms de 40 semanas no tienen xito con este procedimiento.  Hay un aumento de fracasos en embarazadas obesas.  Hay un aumento del porcentaje de fracasos si el beb pesa 4 Kg o ms.  Hay aumento en el porcentaje de fracasos si el intervalo entre la operacin cesrea y el parto vaginal es de menos de 19 meses.  Hay un aumento en el porcentaje de fracasos si ha sufrido preeclampsia hipertensin arterial, protenas en la orina e hinchazn del rostro y las extremidades.  El parto vaginal ser muy exitoso si tuvo un parto vaginal previo.  Tambin es [employer] caso que el [place] de  parto comience espontneamente antes de la fecha.  El parto vaginal luego de Neomia Dear cesrea es similar a un parto espontneo vaginal normal. Es importante que converse con su mdico desde comienzos del Psychiatrist de modo que pueda Google, beneficios y opciones. De este modo tendr tiempo de decidir que es lo mejor en su caso particular en relacin a su parto por cesrea anterior. Hay que tener en cuenta que puede haber cambios en la madre durante el [place], lo que hace necesario cambiar su decisin o la del mdico. Los consejos, preocupaciones y decisiones debern documentarse en la historia clnica y debe ser firmada por todas las partes. Document Released: 04/17/2008 Document Revised: 01/22/2012 [facility] Patient Information 2014 [place], [place].

## 2013-09-15 NOTE — Progress Notes (Signed)
P = [phone] 

## 2013-10-06 ENCOUNTER — Other Ambulatory Visit (HOSPITAL_COMMUNITY): Payer: Self-pay | Admitting: *Deleted

## 2013-10-06 DIAGNOSIS — N6320 Unspecified lump in the left breast, unspecified quadrant: Secondary | ICD-10-CM

## 2013-10-07 ENCOUNTER — Other Ambulatory Visit: Payer: Self-pay

## 2013-10-07 ENCOUNTER — Ambulatory Visit (HOSPITAL_COMMUNITY): Payer: Self-pay

## 2013-10-13 ENCOUNTER — Encounter: Payer: Self-pay | Admitting: Family Medicine

## 2013-10-13 ENCOUNTER — Ambulatory Visit (INDEPENDENT_AMBULATORY_CARE_PROVIDER_SITE_OTHER): Payer: Self-pay | Admitting: Family Medicine

## 2013-10-13 VITALS — BP 106/57 | Wt 121.0 lb

## 2013-10-13 DIAGNOSIS — Z3492 Encounter for supervision of normal pregnancy, unspecified, second trimester: Secondary | ICD-10-CM

## 2013-10-13 DIAGNOSIS — Z3480 Encounter for supervision of other normal pregnancy, unspecified trimester: Secondary | ICD-10-CM

## 2013-10-13 NOTE — Progress Notes (Signed)
P-[id number] 

## 2013-10-13 NOTE — Progress Notes (Signed)
Doing well no complaints  

## 2013-10-13 NOTE — Patient Instructions (Addendum)
Parto vaginal luego de una cesrea (Vaginal Birth After Cesarean Delivery) Un parto vaginal luego de un parto por cesrea es dar a luz por la vagina luego de haber dado a luz por medio de una intervencin [place]. En el pasado, si una mujer tena un beb por cesrea, todos los partos posteriores deban hacerse por cesrea. Esto ya no es as. Puede ser seguro para la mam intentar un parto vaginal luego de una cesrea. La decisin final de tener un parto vaginal o por cesrea debe tomarse en conjunto, entre la paciente y el mdico. [place] riesgos y los beneficios debern evaluarse con relacin a los motivos y al tipo de cesrea previa LAS MUJERES QUE QUIEREN TENER UN PARTO VAGINAL, DEBEN CONSULTAR CON SU MDICO PARA ASEGURARSE QUE:  La cesrea anterior se realiz con una incisin uterina transversal (no con una incisin vertical clsica).  El canal de parto es lo suficientemente grande como para que pase el [place].  No ha sido sometida a otras operaciones del tero.  Durante el trabajo de parto le realizarn un monitoreo electrnico fetal, en todo momento.  Es necesario que haya un quirfano disponible y listo en caso de necesitar una cesrea de emergencia.  Un cirujano y personal de quirfano estarn disponibles en todo momento durante el [place] de parto, para realizar una cesrea en caso de ser necesario.  Habr un anestesista disponible en caso de necesitar una cesrea de emergencia.  La nursery est lista cuenta con personal especializado y el equipo disponible para cuidar al beb en caso de emergencia. BENEFICIOS  Permanencia ms breve en el hospital.  Menores costos en el parto, la nurse y el hospital.  Menos prdida de sangre y menos probabilidad de necesitar una transfusin sangunea.  Menos probabilidad de tener fiebre o molestias como consecuencia de una ciruga mayor  Menos riesgo de cogulos sanguneos.  Menos riesgo de sufrir infecciones.  Recuperacin ms rpida luego  del alta mdica.  Menos riesgo de complicaciones quirrgicas, como apertura o hernia de la incisin.  Disminucin del riesgo de lesiones a otros rganos  Menor riesgo de remocin del tero (histerectoma)  Menor riesgo de que la placenta cubra parcial o completamente la abertura del tero (placenta previa) en embarazos futuros  Posibilidad de tener una familia grande, si lo desea. RIESGOS  Ruptura del tero.  Si el tero se rompe Production manager.  Todas las complicaciones de [place] mayor y lesiones en otros rganos.  Hemorragia excesiva, cogulos e infeccin.  [place] Puntuacin Apgar baja (mtodo que evala al recin nacido segn su apariencia, pulso, muecas, actividad y respiracion) y ms riesgos para el beb.  Hay mas riesgo de ruptura del tero si se induce o aumenta el trabajo de [place].  Hay un mayor riesgo de ruptura uterina si se usan medicamentos para madurar el cuello. NO DEBE LLEVARSE A CABO SI:  La cesrea previa se realiz con una incicin vertical (clsica) o con forma de T, o usted no sabe cul de [employer] han practicado.  Ha sufrido ruptura del tero.  Le han practicado una ciruga de tero.  Tiene problemas mdicos u obsttricos.  El beb est en problemas.  Tuvo dos cesreas previas y ningn parto vaginal. OTRAS COSAS QUE DEBE SABER:  La anestesia peridural es segura.  Es seguro dar vuelta al beb si se encuentra de nalgas (intentar una versin ceflica externa).  Es seguro intentarlo en caso de mellizos.  Los embarazos de ms de 40 semanas no tienen  xito con este procedimiento.  Hay un aumento de fracasos en embarazadas obesas.  Hay un aumento del porcentaje de fracasos si el beb pesa 4 Kg o ms.  Hay aumento en el porcentaje de fracasos si el intervalo entre la operacin cesrea y el parto vaginal es de menos de 19 meses.  Hay un aumento en el porcentaje de fracasos si ha sufrido preeclampsia hipertensin arterial,  protenas en la orina e hinchazn del rostro y las extremidades.  El parto vaginal ser muy exitoso si tuvo un parto vaginal previo.  Tambin es [employer] caso que el [place] de parto comience espontneamente antes de la fecha.  El parto vaginal luego de Neomia Dear cesrea es similar a un parto espontneo vaginal normal. Es importante que converse con su mdico desde comienzos del Psychiatrist de modo que pueda Google, beneficios y opciones. De este modo tendr tiempo de decidir que es lo mejor en su caso particular en relacin a su parto por cesrea anterior. Hay que tener en cuenta que puede haber cambios en la madre durante el [place], lo que hace necesario cambiar su decisin o la del mdico. Los consejos, preocupaciones y decisiones debern documentarse en la historia clnica y debe ser firmada por todas las partes. Document Released: 04/17/2008 Document Revised: 01/22/2012 [facility] Patient Information 2014 [place], [place].  Lactancia materna  (Breastfeeding)  El cambio hormonal durante el Psychiatrist produce el desarrollo del tejido Melia y un aumento en el nmero y tamao de los conductos galactforos. La hormona prolactina permite que las protenas, los azcares y las grasas de la sangre produzcan la [employer] materna en las glndulas productoras de [place]. La hormona progesterona impide que la leche materna sea liberada antes del nacimiento del beb. Despus del nacimiento del beb, su nivel de progesterona disminuye permitiendo que la leche materna sea [place]. Pensar en el beb, as como la succin o Theatre manager, pueden estimular la liberacin de [place] de las glndulas productoras de [place].  La decisin de Company secretary) es una de las mejores opciones que usted puede hacer para usted y su beb. La informacin que sigue da una breve resea de los beneficios, as Lexicographer que debe saber sobre la [place].  LOS BENEFICIOS DE AMAMANTAR  Para el  beb   La primera leche (calostro) ayuda al mejor funcionamiento del sistema digestivo del beb.   La leche tiene anticuerpos que provienen de la madre y que ayudan a prevenir las infecciones en el beb.   El beb tiene una menor incidencia de asma, alergias y del sndrome de muerte sbita del lactante (SMSL).   Los nutrientes de la [place] materna son mejores para el beb que la [place].  La leche materna mejora el desarrollo cerebral del beb.   Su beb tendr menos gases, clicos y estreimiento.  Es menos probable que el beb desarrolle otras enfermedades, como obesidad infantil, asma o diabetes mellitus. Para usted   La lactancia materna favorece el desarrollo de un vnculo muy especial entre la madre y el beb.   Es ms conveniente, siempre disponible, a la [place] y [place].   La lactancia materna ayuda a quemar caloras y a perder el peso ganado durante el [place].   Hace que el tero se contraiga ms rpidamente a su tamao normal y [employer] sangrado despus del [place].   Las [employer] que amamantan tienen menos riesgo de Environmental education officer osteoporosis o cncer de mama o de ovario en el futuro.  FRECUENCIA  DEL AMAMANTAMIENTO   Un beb sano, nacido a trmino, puede amamantarse con tanta frecuencia como cada hora, o espaciar las comidas cada tres horas. La frecuencia en la lactancia varan de un beb a otro.   Los recin nacidos deben ser alimentados por lo menos cada 2-3 horas Administrator y cada 4-5 horas durante la noche. Usted debe amamantarlo un mnimo de 8 tomas en un perodo de 24 horas.  Despierte al beb para amamantarlo si han pasado 3-4 horas desde la ltima comida.  Amamante cuando sienta la necesidad de reducir la plenitud de sus senos o cuando el beb muestre signos de [place]. Las seales de que el beb puede Gentry Fitz son:  Lenora Boys su estado de alerta o vigilancia.  Se estira.  Mueve la cabeza de un lado a otro.  Mueve  la cabeza y abre la boca cuando se le toca la mejilla o la boca (reflejo de succin).  Aumenta las vocalizaciones, tales como sonidos de succin, relamerse los labios, arrullos, suspiros, o chirridos.  Mueve la [employer] boca.  Se chupa con ganas los dedos o las manos.  Agitacin.  Llanto intermitente.  Los signos de hambre extrema requerirn que lo calme y lo consuele antes de tratar de alimentarlo. Los signos de hambre extrema son:  Agitacin.  Llanto fuerte e intenso.  Gritos.  El amamantamiento frecuente la ayudar a producir ms [place] y a Education officer, community de Engineer, mining en los pezones e hinchazn de las [place].  LACTANCIA MATERNA   Ya sea que se encuentre acostada o sentada, asegrese que el abdomen del beb est enfrente el suyo.   Sostenga la mama con el pulgar por arriba y los otros 4 dedos por debajo del pezn. Asegrese que sus dedos se encuentren lejos del pezn y de la boca del beb.   Empuje suavemente los labios del beb con el pezn o con el dedo.   Cuando la boca del beb se abra lo suficiente, introduzca el pezn y la zona oscura que lo rodea (areola) tanto como le sea posible dentro de la boca.  Debe haber ms areola visible por arriba del labio superior que por debajo del labio inferior.  La lengua del beb debe estar entre la enca inferior y el seno.  Asegrese de que la boca del beb est en la posicin correcta alrededor del pezn (prendida). Los labios del beb deben crear un sello sobre su pecho.  Las seales de que el beb se ha prendido eficazmente al pezn son:  Payton Doughty o succiona sin dolor.  Se escucha que traga [employer].  No hace ruidos ni chasquidos.  Hay movimientos musculares por arriba y por delante de sus odos al Printmaker.  El beb debe succionar unos 2-3 minutos para que salga la [place]. Permita que el nio se alimente en cada mama todo lo que desee. Alimente al beb hasta que se desprenda o se quede dormido en Biomedical engineer y luego ofrzcale el segundo pecho.  Las seales de que el beb est lleno y satisfecho son:  Disminuye gradualmente el nmero de succiones o no succiona.  Se queda dormido.  Extiende o relaja su cuerpo.  Retiene una pequea cantidad de [facility] boca.  Se desprende del pecho por s mismo.  Los signos de una lactancia materna eficaz son:  Los senos han aumentado la firmeza, el peso y el tamao antes de la alimentacin.  Son ms blandos despus de amamantar.  Un aumento del  volumen de [place], y tambin el cambio de su consistencia y color se producen hacia el quinto da de Tour manager.  La congestin mamaria se [place] al dar de [place].  Los pezones no duelen, ni estn agrietados ni sangran.  De ser necesario, interrumpa la succin poniendo su dedo en la esquina de la boca del beb y deslizando el dedo entre sus encas. A continuacin, retire la mama de su boca.  Es comn que los bebs regurgiten un poco despus de comer.  A menudo los bebs tragan aire al alimentarse. Esto puede hacer que se sienta molesto. Hacer eructar al beb al [place] de pecho puede ser de [place].  Se recomiendan suplementos de vitamina D para los bebs que reciben slo [address]  Evite el uso del chupete durante las primeras 4 a 6 semanas de vida.  Evite la alimentacin suplementaria con agua, frmula o jugo en lugar de la [employer]. La leche materna es todo el alimento que el beb necesita. No es necesario que el nio ingiera agua o preparados de bibern. Sus pechos producirn ms leche si se evita la alimentacin suplementaria durante las primeras semanas. COMO SABER SI EL BEB OBTIENE LA SUFICIENTE LECHE MATERNA  Preguntarse si el beb obtiene la cantidad suficiente de [place] es una preocupacin frecuente [employer]. Puede asegurarse que el beb tiene la leche suficiente si:   El beb succiona activamente y usted escucha que traga.   El beb parece estar relajado y  satisfecho despus de Psychologist, clinical.   El nio se alimenta al menos 8 a 12 veces en 24 horas.  Durante los primeros 3 a 5 das de vida:  Moja 3-5 paales en 24 horas. La materia fecal debe ser blanda y [place].  Tiene al menos 3 a 4 deposiciones en 24 horas. La materia fecal debe ser blanda y [place].  A los 5-7 das de vida, el beb debe tener al menos 3-6 deposiciones en 24 horas. La materia fecal debe ser grumosa y [place] a los [address]  [address] de [place].  Su beb tiene una prdida de Psychologist, counselling a 7al 10% durante los primeros 3 [address]  [address] de [address]  El beb no pierde peso despus de 3-7 [address] de [address]  El beb debe aumentar 4 a 6 libras (120 a 170 gr.) por semana despus de los 4 [address] de vida.  Aumenta de [place] a los [address] de vida y vuelve al peso del nacimiento dentro de las 2 semanas. CONGESTIN MAMARIA  Durante la primera semana despus del [place], usted puede experimentar hinchazn en las mamas (congestin [place]). Al estar congestionadas, las mamas se sienten pesadas, calientes o sensibles al tacto. El pico de la congestin ocurre a las 24 -48 horas despus del parto.   La congestin puede disminuirse:  Continuando con la Tour manager.  Aumentando la frecuencia.  Tomando duchas calientes o aplicando calor hmedo en los senos antes de cada comida. Esto aumenta la circulacin y [place] a que la [place].   Masajeando suavemente el pecho antes y [place] [employer]. Con las yemas de los dedos, masajee la pared del pecho hacia el pezn en un movimiento circular.   Asegurarse de que el beb vaca al menos uno de sus pechos en cada alimentacin. Tambin ayuda si comienza la siguiente toma en el otro seno.   Extraiga manualmente o con un sacaleches las mamas para vaciar los pechos si el beb tiene sueo o no se aliment bien. Tambin puede extraer la [employer]  vuelva a trabajar o si siente que se estn congestionando las mamas.  Asegrese de que el beb se prende y est bien colocado  durante la Market researcher. Si sigue estas indicaciones, la congestin debe mejorar en 24 a 48 horas. Si an tiene dificultades, consulte a Barista.  CUDESE USTED MISMA  Cuide sus mamas.   Bese o dchese diariamente.   Evite usar [employer].   Use un sostn de soporte Evite el uso de sostenes con aro.  Seque al aire sus pezones durante 3-4 minutos despus de cada comida.   Utilice slo apsitos de algodn en el sostn para absorber las prdidas de [place]. La prdida de un poco de [employer] las comidas es normal.   Use solamente lanolina pura en sus pezones despus de Museum/gallery exhibitions officer. Usted no tiene que lavarla antes de alimentar al beb. Otra opcin es sacarse unas gotas de [place] y [employer] pezones.  Continuar con los autocontroles de la mama. Cudese.   Consuma alimentos saludables. Alterne 3 comidas con 3 colaciones.  Evite los alimentos que usted nota que perjudican al beb.  Dixie Dials, jugos de fruta y agua para Patent examiner su sed (aproximadamente 8 vasos al Futures trader).   Descanse con frecuencia, reljese y tome sus vitaminas prenatales para evitar la fatiga, el estrs y la anemia.  Evite masticar y fumar tabaco.  Evite el consumo de alcohol y drogas.  Tome medicamentos de venta libre y recetados tal como le indic su mdico o Social research officer, government. Siempre debe consultar con su mdico o farmacutico antes de tomar cualquier medicamento, vitamina o suplemento de hierbas.  Sepa que durante la lactancia puede quedar embarazada. Si lo desea, hable con su mdico acerca de la planificacin familiar y los mtodos anticonceptivos seguros que puede utilizar durante la Market researcher. SOLICITE ATENCIN MDICA SI:   Usted siente que quiere dejar de Museum/gallery exhibitions officer o se siente frustrada con la lactancia.  Siente dolor en los senos o en los pezones.  Sus pezones estn agrietados o Water quality scientist.  Sus pechos estn irritados, sensibles o calientes.  Tiene un rea  hinchada en cualquiera de los senos.  Siente escalofros o fiebre.  Tiene nuseas o vmitos.  Observa un drenaje en los pezones.  Sus mamas no se llenan antes de Marine scientist al 5to da despus del [place].  Se siente triste y deprimida.  El nio est demasiado somnoliento como para comer.  El nio tiene problemas para Industrial/product designer.   Moja menos de 3 paales en 24 horas.  Mueve el intestino menos de 3 veces en 24 horas.  La piel del beb o la parte blanca de sus ojos est ms amarilla.   El beb no ha aumentado de [place] a los [address] de [place]. ASEGRESE DE QUE:   Comprende estas instrucciones.  Controlar su enfermedad.  Solicitar ayuda de inmediato si no mejora o si empeora. Document Released: 10/30/2005 Document Revised: 07/24/2012 [facility] Patient Information 2014 [place], [place].

## 2013-10-13 NOTE — Assessment & Plan Note (Signed)
Continue routine prenatal care.  

## 2013-11-10 ENCOUNTER — Ambulatory Visit (INDEPENDENT_AMBULATORY_CARE_PROVIDER_SITE_OTHER): Payer: Self-pay | Admitting: Obstetrics & Gynecology

## 2013-11-10 VITALS — BP 106/60 | Wt 123.2 lb

## 2013-11-10 DIAGNOSIS — O09529 Supervision of elderly multigravida, unspecified trimester: Secondary | ICD-10-CM

## 2013-11-10 DIAGNOSIS — Z302 Encounter for sterilization: Secondary | ICD-10-CM

## 2013-11-10 DIAGNOSIS — Z3480 Encounter for supervision of other normal pregnancy, unspecified trimester: Secondary | ICD-10-CM

## 2013-11-10 DIAGNOSIS — Z3493 Encounter for supervision of normal pregnancy, unspecified, third trimester: Secondary | ICD-10-CM

## 2013-11-10 DIAGNOSIS — Z758 Other problems related to medical facilities and other health care: Secondary | ICD-10-CM

## 2013-11-10 DIAGNOSIS — Z789 Other specified health status: Secondary | ICD-10-CM

## 2013-11-10 DIAGNOSIS — O34219 Maternal care for unspecified type scar from previous cesarean delivery: Secondary | ICD-10-CM

## 2013-11-10 DIAGNOSIS — Z609 Problem related to social environment, unspecified: Secondary | ICD-10-CM

## 2013-11-10 DIAGNOSIS — O09522 Supervision of elderly multigravida, second trimester: Secondary | ICD-10-CM

## 2013-11-10 NOTE — Patient Instructions (Addendum)
Regrese a la clinica cuando tenga su cita. Si tiene problemas o preguntas, llama a la clinica o vaya a la sala de emergencia al [employer].  Vacuna difteria, ttanos, tos ferina (DTP) - Lo que debe saber  (Tetanus, Diphtheria, Pertussis [Tdap] Vaccine, What You Need to Know) PORQU VACUNARSE?  El ttanos, la difteria y la tos Benetta Spar pueden ser enfermedades muy graves, an en adolescentes y [place]. La vacuna Tdap nos puede proteger de estas enfermedades.  El TTANOS (Trismo) provoca la contraccin dolorosa de los msculos, por lo general, en todo el cuerpo.   Puede causar el endurecimiento de los msculos de la cabeza y el cuello, de modo que impide abrir la boca, tragar y en algunos casos, Industrial/product designer. El ttanos causa la muerte de 1 de cada 5 personas que se infectan. La DIFTERIA produce la formacin de una membrana gruesa que cubre el fondo de la garganta.   Puede causar problemas respiratorios, parlisis, insuficiencia cardaca e incluso la muerte. TOS FERINA (Pertusis) causa episodios de tos graves, que pueden hacer difcil la respiracin, causar vmitos y trastornos del sueo.   Tambin puede ser la causa de prdida de [place], incontinencia y Surveyor, minerals de Forensic psychologist. Dos de cada 100 adolescentes y Orthoptist de cada 100 adultos que enferman de pertusis deben ser hospitalizados, tienen complicaciones como la neumona o [place]. Estas enfermedades son provocadas por bacterias. La difteria y el pertusis se contagian de persona a persona a travs de la tos o el estornudo. El ttanos ingresa al organismo a travs de cortes, rasguos o heridas.  Antes de las vacunas, en los [place] se vieron ms de 200.000 casos al ao de difteria y tos [place] y cientos de casos de ttanos. Desde el inicio de la vacunacin, los casos de ttanos y difteria han disminuido alrededor del 99% y los casos de tos ferina alrededor del 80%.  Tdap  La vacuna Tdap protege a adolescentes y adultos contra el ttanos, la  difteria y la tos [place]. Una dosis de Tdap se administra a los 11 o 12 aos de edad. Las [employer] no recibieron la vacuna Tdap a esa edad deben recibirla tan pronto como sea posible.  Es muy importante que los profesionales de la salud y todos aquellos que tengan contacto cercano con bebs menores de 12 meses reciban la Tdap.  Las mujeres embarazadas deben recibir una dosis de Tdap en cada Psychiatrist, para proteger al recin nacido de la tos [place]. Los nios tienen mayor riesgo de complicaciones graves y potencialmente mortales debido a la tos [place].  Una vacuna similar, llamada Td, protege contra el ttanos y la difteria, pero no contra la tos [place]. Cada 10 aos debe recibirse un refuerzo de Td. La Tdap se puede administrar como uno de estos refuerzos, si todava no ha recibido una dosis. Tambin se puede aplicar despus de un corte o quemadura grave para prevenir la infeccin por ttanos.  El mdico le dar ms informacin.  La Tdap puede administrarse de manera segura simultneamente con otras vacunas.  ALGUNAS PERSONAS NO DEBEN RECIBIR ESTA VACUNA.   Si alguna vez tuvo una reaccin alrgica potencialmente mortal despus de Neomia Dear dosis de la vacuna contra el ttanos, la diferia o la tos [place], o tuvo una alergia grave a cualquiera de los componentes de esta vacuna, no debe aplicarse la vacuna. Informe a su mdico si usted sufre algn tipo de alergia grave.  Si estuvo en coma o sufri mltiples convulsiones dentro de los [address]  despus de una dosis de DTP o DTaP no debe recibir la Tdap, salvo que se encuentre otra causa En este caso puede recibir la Td.  Consulte con su mdico si:  tiene epilepsia u otra enfermedad del sistema nervioso,  siente dolor intenso o se hincha despus de recibir cualquier vacuna contra la difteria, el ttanos o la tos [place],  alguna vez ha sufrido el sndrome de Pension scheme manager,  no se siente Research scientist (life sciences) en que se ha programado la vacuna. RIESGOS DE  UNA REACCIN A LA VACUNA Con cualquier medicamento, incluyendo las vacunas, existe la posibilidad de que aparezcan efectos secundarios. Estos son leves y desaparecen por s solos, pero tambin son posibles las reacciones graves.  Breves episodios de [employer] seguir a una vacunacin, causando lesiones por la cada. Sentarse o recostarse durante 15 minutos puede ayudar a International aid/development worker. Informe al mdico si se siente mareado o aturdido, tiene [employer] visin o zumbidos en los odos.  Problemas leves luego de la Tdap (no interferirn con las actividades)   Dolor en el sitio de la inyeccin (alrededor de 1 de cada 4 adolescentes o 2 de cada 3 adultos).  Enrojecimiento o hinchazn en el lugar de la inyeccin (1 de cada 5 personas).  Fiebre leve de al menos 100,4 F (38 C) (hasta alrededor de 1 cada 25 adolescentes y 1 de cada 100 adultos).  Dolor de cabeza (3 o 4de cada 10 personas).  Cansancio (1 de cada 3 o 4 personas).  Nuseas, vmitos, diarrea, dolor de estmago (1 de cada 4 adolescentes o 1 de cada 10 adultos).  Escalofros, dolores corporales, dolor articular, erupciones, inflamacin de las glndulas (poco frecuente). Problemas moderados: (interfieren con las actividades, pero no requieren atencin mdica)   Art therapist de la inyeccin (1 de cada 5 adolescentes o 1 de cada 100 adultos).  Enrojecimiento o inflamacin (1 de cada 16 adolescentes y 1 de cada 25 adultos).  Fiebre de ms de 102F o 38,9C (1 de cada 100 adolescentes o 1 de cada 250 adultos).  Dolor de cabeza (alrededor de 4 de cada 20 adolescentes y 3 de cada 10 adultos).  Nuseas, vmitos, diarrea, dolor de estmago (1 a 3 de cada 100 personas).  Hinchazn de todo el brazo en el que se aplic la vacuna (3 de cada100 personas). Problemas graves: luego de la Tdap (no puede Education officer, environmental las actividades habituales, requiere atencin mdica)   Inflamacin, dolor intenso, sangrado y enrojecimiento en el brazo, en el  sitio de la inyeccin (poco frecuente). Una reaccin alrgica grave puede ocurrir despus de la administracin de cualquier vacuna (se estima en menos de 1 en un milln de dosis).  QU PASA SI HAY UNA REACCIN GRAVE?  Qu signos debo buscar?  Observe todo lo que le preocupe, como signos de una reaccin alrgica grave, fiebre muy alta o cambios en el comportamiento. Los signos de Runner, broadcasting/film/video grave pueden incluir urticaria, hinchazn de la cara y la garganta, dificultad para respirar, ritmo cardaco acelerado, mareos y debilidad. Estos sntomas pueden comenzar entre unos pocos minutos y algunas horas despus de la vacunacin.  Qu debo hacer?  Si usted piensa que se trata de una reaccin alrgica grave o de otra emergencia que no puede esperar, llame al 911 o lleve a la persona al hospital ms cercano. De lo contrario, llame a su mdico.  Despus, la reaccin debe informarse a la "Vaccine Adverse Event Reporting System" (Sistema de informacin sobre efectos adversos de las  vacunas -VAERS). El mdico o usted mismo pueden Sales executive informe en el sitio web del VAERS www.vaers.RepJet.co.nz llame al [phone]. El VAERS es slo para Biomedical engineer. No brindan consejo mdico.  PROGRAMA NACIONAL DE COMPENSACIN DE DAOS POR VACUNAS  El National Vaccine Injury Compensation Program (VICP) es un programa federal que fue creado para compensar a las personas que puedan haber sufrido daos al recibir ciertas vacunas.  Aquellas personas que consideren que han sufrido un dao como consecuencia de una vacuna y quieren saber ms acerca del programa y como presentar Roslynn Amble, pueden llamar [phone] o visite el sitio web del VICP en SpiritualWord.[email or web address].  CMO PUEDO OBTENER MS INFORMACIN?   Consulte a su mdico.  Comunquese con el servicio de salud de su localidad o [address].  Comunquese con los Centros para el control y la prevencin de Child psychotherapist for  Disease Control and Prevention , CDC).  llamando al [phone] o visitando el sitio web del CDC en PicCapture.uy. CDC Tdap Vaccine VIS (03/21/12)  Document Released: 10/16/2012 [facility] Patient Information 2014 [place], [place].  Influenza Virus Vaccine injection (Fluarix) Qu es este medicamento? La VACUNA ANTIGRIPAL ayuda a disminuir el riesgo de contraer la influenza, tambin conocida como la gripe. La vacuna solo ayuda a protegerle contra algunas cepas de influenza. Esta vacuna no ayuda a reducir Nurse, adult de contraer influenza pandmica H1N1. Este medicamento puede ser utilizado para otros usos; si tiene alguna pregunta consulte con su proveedor de atencin mdica o con su farmacutico. MARCAS COMERCIALES DISPONIBLES: Fluarix, Fluzone Qu le debo informar a mi profesional de la salud antes de tomar este medicamento? Necesita saber si usted presenta alguno de los siguientes problemas o situaciones: -trastorno de sangrado como hemofilia -fiebre o infeccin -sndrome de Guillain-Barre u otros problemas neurolgicos -problemas del sistema inmunolgico -infeccin por el virus de la inmunodeficiencia humana (VIH) o SIDA -niveles bajos de plaquetas en la sangre -esclerosis mltiple -una Automotive engineer o inusual a las vacunas antigripales, a los huevos, protenas de pollo, al ltex, a la gentamicina, a otros medicamentos, alimentos, colorantes o conservantes -si est embarazada o buscando quedar embarazada -si est amamantando a un beb Cmo debo utilizar este medicamento? Esta vacuna se administra mediante inyeccin por va intramuscular. Lo administra un profesional de [employer]. Recibir una copia de informacin escrita sobre la vacuna antes de cada vacuna. Asegrese de leer este folleto cada vez cuidadosamente. Este folleto puede cambiar con frecuencia. Hable con su pediatra para informarse acerca del uso de este medicamento en nios. Puede requerir atencin  especial. Sobredosis: Pngase en contacto inmediatamente con un centro toxicolgico o una sala de urgencia si usted cree que haya tomado demasiado medicamento. ATENCIN: [employer] es solo para usted. No comparta este medicamento con nadie. Qu sucede si me olvido de una dosis? No se aplica en este caso. Qu puede interactuar con este medicamento? -quimioterapia o radioterapia -medicamentos que suprimen el sistema inmunolgico, tales como etanercept, anakinra, infliximab y adalimumab -medicamentos que tratan o previenen cogulos sanguneos, como warfarina -fenitona -medicamentos esteroideos, como la prednisona o la cortisona -teofilina -vacunas Puede ser que esta lista no menciona todas las posibles interacciones. Informe a su profesional de [employer] de [employer] productos a base de hierbas, medicamentos de [place] o suplementos nutritivos que est tomando. Si usted fuma, consume bebidas alcohlicas o si utiliza drogas ilegales, indqueselo tambin a su profesional de [employer]. Algunas sustancias pueden interactuar con su medicamento. A qu debo estar atento al usar [employer]? Informe a  su mdico o a Producer, television/film/video de [employer] todos los efectos secundarios que persistan despus de [address]. Llame a su proveedor de atencin mdica si se presentan sntomas inusuales dentro de las 6 semanas posteriores a la vacunacin. Es posible que todava pueda contraer la gripe, pero la enfermedad no ser tan fuerte como normalmente. No puede contraer la gripe de esta vacuna. La vacuna antigripal no le protege contra resfros u otras enfermedades que pueden causar [place]. Debe vacunarse cada ao. Qu efectos secundarios puedo tener al [employer] este medicamento? Efectos secundarios que debe informar a su mdico o a Producer, television/film/video de la salud tan pronto como sea posible: -reacciones alrgicas como erupcin cutnea, picazn o urticarias, hinchazn de la cara, labios o lengua Efectos  secundarios que, por lo general, no requieren atencin mdica (debe informarlos a su mdico o a su profesional de la salud si persisten o si son molestos): -fiebre -dolor de cabeza -molestias y dolores musculares -dolor, sensibilidad, enrojecimiento o Paramedic de la inyeccin -cansancio o debilidad Puede ser que esta lista no menciona todos los posibles efectos secundarios. Comunquese a su mdico por asesoramiento mdico [employer]. Usted puede informar los efectos secundarios a la FDA por telfono al 1-800-FDA-1088. Dnde debo guardar mi medicina? Esta vacuna se administra solamente en clnicas, farmacias, consultorio mdico u otro consultorio de un profesional de la salud y no Teacher, early years/pre en su domicilio. ATENCIN: Este folleto es un resumen. Puede ser que no cubra toda la posible informacin. Si usted tiene preguntas acerca de esta medicina, consulte con su mdico, su farmacutico o su profesional de Radiographer, therapeutic.  2014, Elsevier/Gold Standard. (2010-05-03 15:31:40)

## 2013-11-10 NOTE — Progress Notes (Signed)
Patient is Spanish-speaking only, Spanish interpreter present for this encounter. No other complaints or concerns.  Fetal movement and labor precautions reviewed. Third trimester labs next visit. Encouraged to call [facility] to get Tdap vaccine and Flu vaccine if desires

## 2013-12-01 ENCOUNTER — Ambulatory Visit (INDEPENDENT_AMBULATORY_CARE_PROVIDER_SITE_OTHER): Payer: Self-pay | Admitting: Obstetrics & Gynecology

## 2013-12-01 VITALS — BP 101/66 | Wt 126.0 lb

## 2013-12-01 DIAGNOSIS — Z603 Acculturation difficulty: Secondary | ICD-10-CM

## 2013-12-01 DIAGNOSIS — Z302 Encounter for sterilization: Secondary | ICD-10-CM

## 2013-12-01 DIAGNOSIS — Z789 Other specified health status: Secondary | ICD-10-CM

## 2013-12-01 DIAGNOSIS — Z3480 Encounter for supervision of other normal pregnancy, unspecified trimester: Secondary | ICD-10-CM

## 2013-12-01 DIAGNOSIS — O34219 Maternal care for unspecified type scar from previous cesarean delivery: Secondary | ICD-10-CM

## 2013-12-01 DIAGNOSIS — Z609 Problem related to social environment, unspecified: Secondary | ICD-10-CM

## 2013-12-01 DIAGNOSIS — Z3490 Encounter for supervision of normal pregnancy, unspecified, unspecified trimester: Secondary | ICD-10-CM

## 2013-12-01 LAB — CBC
HCT: 32.2 % — ABNORMAL LOW (ref 36.0–46.0)
HEMOGLOBIN: 11.2 g/dL — AB (ref 12.0–15.0)
MCH: 32.6 pg (ref 26.0–34.0)
MCHC: 34.8 g/dL (ref 30.0–36.0)
MCV: 93.6 fL (ref 78.0–100.0)
Platelets: 209 10*3/uL (ref 150–400)
RBC: 3.44 MIL/uL — AB (ref 3.87–5.11)
RDW: 14.1 % (ref 11.5–15.5)
WBC: 8.3 10*3/uL (ref 4.0–10.5)

## 2013-12-01 LAB — HIV ANTIBODY (ROUTINE TESTING W REFLEX): HIV: NONREACTIVE

## 2013-12-01 NOTE — Progress Notes (Signed)
Patient is Spanish-speaking only, Spanish interpreter present for this encounter. Third trimester labs today.  [facility] No other complaints or concerns.  Fetal movement and labor precautions reviewed

## 2013-12-01 NOTE — Patient Instructions (Signed)
Regrese a la clinica cuando tenga su cita. Si tiene problemas o preguntas, llama a la clinica o vaya a la sala de emergencia al [facility].    

## 2013-12-01 NOTE — Progress Notes (Signed)
P = 92 

## 2013-12-02 LAB — RPR

## 2013-12-02 LAB — GLUCOSE TOLERANCE, 1 HOUR (50G) W/O FASTING: Glucose, 1 Hour GTT: 139 mg/dL (ref 70–140)

## 2013-12-03 ENCOUNTER — Encounter: Payer: Self-pay | Admitting: Obstetrics & Gynecology

## 2013-12-08 ENCOUNTER — Telehealth: Payer: Self-pay

## 2013-12-08 NOTE — Telephone Encounter (Signed)
CALLED PATIENT TO INFORM HER OF GLUCOSE RESULTS. SCHEDULED HER TO RETURN TO OFFICE FOR 3 HR GTT. SHE WILL RETURN TO OFFICE ON 12/11/13 AT 8:30AM

## 2013-12-11 ENCOUNTER — Other Ambulatory Visit (INDEPENDENT_AMBULATORY_CARE_PROVIDER_SITE_OTHER): Payer: Self-pay | Admitting: *Deleted

## 2013-12-11 DIAGNOSIS — O99810 Abnormal glucose complicating pregnancy: Secondary | ICD-10-CM

## 2013-12-11 DIAGNOSIS — R7309 Other abnormal glucose: Secondary | ICD-10-CM

## 2013-12-11 NOTE — Progress Notes (Signed)
Pt here today for a 3 hr GTT.  

## 2013-12-12 LAB — GLUCOSE TOLERANCE, 3 HOURS
GLUCOSE 3 HOUR GTT: 134 mg/dL (ref 70–144)
Glucose Tolerance, 1 hour: 155 mg/dL (ref 70–189)
Glucose Tolerance, 2 hour: 99 mg/dL (ref 70–164)
Glucose Tolerance, Fasting: 76 mg/dL (ref 70–104)

## 2013-12-22 ENCOUNTER — Ambulatory Visit (INDEPENDENT_AMBULATORY_CARE_PROVIDER_SITE_OTHER): Payer: Self-pay | Admitting: Obstetrics & Gynecology

## 2013-12-22 ENCOUNTER — Encounter: Payer: Self-pay | Admitting: Obstetrics & Gynecology

## 2013-12-22 VITALS — BP 112/54 | Wt 128.6 lb

## 2013-12-22 DIAGNOSIS — Z609 Problem related to social environment, unspecified: Secondary | ICD-10-CM

## 2013-12-22 DIAGNOSIS — IMO0002 Reserved for concepts with insufficient information to code with codable children: Secondary | ICD-10-CM

## 2013-12-22 DIAGNOSIS — O34219 Maternal care for unspecified type scar from previous cesarean delivery: Secondary | ICD-10-CM

## 2013-12-22 DIAGNOSIS — Z3490 Encounter for supervision of normal pregnancy, unspecified, unspecified trimester: Secondary | ICD-10-CM

## 2013-12-22 DIAGNOSIS — Z789 Other specified health status: Secondary | ICD-10-CM

## 2013-12-22 DIAGNOSIS — O09529 Supervision of elderly multigravida, unspecified trimester: Secondary | ICD-10-CM

## 2013-12-22 DIAGNOSIS — Z3480 Encounter for supervision of other normal pregnancy, unspecified trimester: Secondary | ICD-10-CM

## 2013-12-22 NOTE — Progress Notes (Signed)
Patient is Spanish-speaking only, Spanish interpreter present for this encounter. RCS and BTL on 02/18/14 with Dr. Erin FullingHarraway-Smith. No other complaints or concerns.  Fetal movement and labor precautions reviewed.

## 2013-12-22 NOTE — Progress Notes (Signed)
P=93 

## 2013-12-22 NOTE — Patient Instructions (Signed)
Regrese a la clinica cuando tenga su cita. Si tiene problemas o preguntas, llama a la clinica o vaya a la sala de emergencia al [facility].    

## 2014-01-05 ENCOUNTER — Ambulatory Visit (INDEPENDENT_AMBULATORY_CARE_PROVIDER_SITE_OTHER): Payer: Self-pay | Admitting: Obstetrics & Gynecology

## 2014-01-05 ENCOUNTER — Encounter: Payer: Self-pay | Admitting: Obstetrics & Gynecology

## 2014-01-05 VITALS — BP 113/65 | Wt 130.0 lb

## 2014-01-05 DIAGNOSIS — Z3490 Encounter for supervision of normal pregnancy, unspecified, unspecified trimester: Secondary | ICD-10-CM

## 2014-01-05 DIAGNOSIS — Z3480 Encounter for supervision of other normal pregnancy, unspecified trimester: Secondary | ICD-10-CM

## 2014-01-05 NOTE — Progress Notes (Signed)
P=93 

## 2014-01-05 NOTE — Progress Notes (Signed)
Routine visit. Good FM. No problems.  

## 2014-01-20 ENCOUNTER — Encounter: Payer: Medicaid Other | Admitting: Obstetrics & Gynecology

## 2014-01-27 ENCOUNTER — Ambulatory Visit (INDEPENDENT_AMBULATORY_CARE_PROVIDER_SITE_OTHER): Payer: Self-pay | Admitting: Obstetrics & Gynecology

## 2014-01-27 ENCOUNTER — Encounter: Payer: Self-pay | Admitting: Obstetrics and Gynecology

## 2014-01-27 ENCOUNTER — Encounter: Payer: Self-pay | Admitting: Obstetrics & Gynecology

## 2014-01-27 VITALS — BP 119/68 | Wt 132.0 lb

## 2014-01-27 DIAGNOSIS — O34219 Maternal care for unspecified type scar from previous cesarean delivery: Secondary | ICD-10-CM

## 2014-01-27 DIAGNOSIS — Z3480 Encounter for supervision of other normal pregnancy, unspecified trimester: Secondary | ICD-10-CM

## 2014-01-27 NOTE — Progress Notes (Signed)
P = 92 

## 2014-01-27 NOTE — Progress Notes (Signed)
GBS not done as pt scheduled for 3rd c-section Pt answered questions re repeat c-section.

## 2014-01-27 NOTE — Patient Instructions (Signed)
Parto por cesrea  (Cesarean Delivery ) El parto por cesrea es el nacimiento de un beb a travs de un corte (incisin) en el abdomen y la matriz (tero).  INFORME A SU MDICO:  Todos los medicamentos que utiliza, incluyendo vitaminas, hierbas, gotas oftlmicas, cremas y medicamentos de venta libre.  Problemas previos que usted o los miembros de su familia hayan tenido con el uso de anestsicos.  Enfermedades de la sangre.  Cirugas previas.  Padecimientos mdicos.  Cualquier alergia que tenga.  Complicaciones del embarazo. RIESGOS Y COMPLICACIONES  Generalmente es un procedimiento seguro. Sin embargo, como en cualquier procedimiento, pueden surgir complicaciones. Las complicaciones posibles son:  Hemorragias.  Infeccin.  Cogulos sanguneos.  Lesin en los rganos circundantes.  Problemas con la anestesia.  Lesin al beb. ANTES DEL PROCEDIMIENTO   Le administrarn un medicamento anticido. Esto impedir que los contenidos cidos del estmago ingresen a los pulmones si vomita durante la ciruga.  Le podrn dar antibiticos para prevenir infecciones. PROCEDIMIENTO   Rasurarn la zona del pubis y la parte inferior del abdomen. Esto se realiza para evitar una infeccin en el sitio de la incisin.  Le colocarn un tubo (catter Foley) en la vejiga para drenar la orina desde la vejiga a una bolsa. Esto mantendr la vejiga vaca durante la ciruga.  Se le colocar una sonda intravenosa en una de las venas.  Le administrarn un medicamento para adormecer a zona inferior del cuerpo anestesia regional). Si estuviera en trabajo de parto, podrn aplicarle una anestesia epidural, que se utiliza tanto en el trabajo de parto como en la cesrea. Puede ser que le administren un medicamento que la har dormir (anestesia general), aunque esto no es tan frecuente.  Le harn una incisin en el abdomen que se extiende hacia el tero. Hay dos tipos bsicos de incisin:  La incisin  horizontal (transversa) Las incisiones horizontales se realizan en la mayor parte de las cesreas de rutina.  La incisin vertical. Se realiza desde la parte de arriba del abdomen hasta la parte de abajo y se usa con menos frecuencia. Se reserva para aquellas mujeres que tienen una complicacin grave (parto prematuro extremo) o bajo situaciones de emergencia.  Las incisiones horizontales y verticales pueden utilizarse ambas al mismo tiempo. Sin embargo, no es muy frecuente.  Luego se realiza una incisin en el tero para que nazca el beb.  El beb nacer.  Luego ambas incisiones se cierran con puntos absorbibles. DESPUS DEL PROCEDIMIENTO   Si estuvo despierta durante la ciruga, podr ver al beb enseguida. Si la duermen, ver al beb tan pronto como despierte.  Podr amamantar a su beb despus del procedimiento.  Podr levantarse y caminar el mismo da de la ciruga. Si debe permanecer en cama durante cierto tiempo, recibir ayuda para darse vuelta, toser y respirar profundamente despus de la ciruga. Esto ayuda a evitar complicaciones en los pulmones, como la neumona.  No se levante de la cama sola la primera vez luego de la ciruga. Necesitar ayuda para levantarse de la cama hasta que pueda hacerlo sola.  Podr darse una ducha el da siguiente a la ciruga. Despus que le quiten el apsito (vendaje) del sitio de la incisin, un enfermero la ayudar a ducharse, si necesita ayuda.  Tendr unas medias compresivas neumticas en la zona inferior de las piernas. Esto se realiza para prevenir la formacin de cogulos sanguneos. Cuando se levante y camine regularmente, ya no sern necesarias.  Nocruce las piernas al sentarse.  Si elimina cogulos   de sangre, gurdelos. Si elimina un cogulo cuando va al bao, por favor no tire la cadena. Llame al enfermero. Comunquele al enfermero si piensa que tiene demasiada hemorragia o que elimina muchos cogulos.  Le darn medicamentos si los  necesita. Dgale a los profesionales si siente dolor. Tambin le indicarn antibiticos para prevenir una infeccin.  Le quitarn la va intravenosa cuando beba una cantidad razonable de lquido. El catter Foley se retirar cuando se levante y camine.  Si su tipo sanguneo es Rh negativo y el beb es Rh positivo, le darn una inyeccin de inmunoglobulina anti D. Esta inyeccin evita que tenga problemas con el Rh en embarazos futuros. Deber colocarse la inyeccin an si se ha hecho atar las trompas (ligadura de trompas).  Si le permiten llevar al beb a dar un paseo, colquelo en la cunita y empjela. No lleve al beb en sus brazos. Document Released: 10/30/2005 Document Revised: 08/20/2013 Exit[employer] Patient Information 2014 [employer].  

## 2014-01-30 ENCOUNTER — Encounter: Payer: Self-pay | Admitting: Family Medicine

## 2014-02-03 ENCOUNTER — Ambulatory Visit (INDEPENDENT_AMBULATORY_CARE_PROVIDER_SITE_OTHER): Payer: Self-pay | Admitting: Obstetrics & Gynecology

## 2014-02-03 VITALS — BP 114/62 | Wt 132.0 lb

## 2014-02-03 DIAGNOSIS — Z3480 Encounter for supervision of other normal pregnancy, unspecified trimester: Secondary | ICD-10-CM

## 2014-02-03 DIAGNOSIS — Z3490 Encounter for supervision of normal pregnancy, unspecified, unspecified trimester: Secondary | ICD-10-CM

## 2014-02-03 DIAGNOSIS — O34219 Maternal care for unspecified type scar from previous cesarean delivery: Secondary | ICD-10-CM

## 2014-02-03 NOTE — Progress Notes (Signed)
Routine visit. Good FM. Cervical cultures obtained today.

## 2014-02-03 NOTE — Progress Notes (Signed)
P = 87 

## 2014-02-04 LAB — GC/CHLAMYDIA PROBE AMP
CT Probe RNA: NEGATIVE
GC Probe RNA: NEGATIVE

## 2014-02-05 ENCOUNTER — Encounter (HOSPITAL_COMMUNITY): Payer: Self-pay | Admitting: Pharmacist

## 2014-02-05 LAB — CULTURE, BETA STREP (GROUP B ONLY)

## 2014-02-10 ENCOUNTER — Ambulatory Visit (INDEPENDENT_AMBULATORY_CARE_PROVIDER_SITE_OTHER): Payer: Self-pay | Admitting: Family Medicine

## 2014-02-10 ENCOUNTER — Encounter: Payer: Self-pay | Admitting: Family Medicine

## 2014-02-10 VITALS — BP 115/65 | Wt 134.0 lb

## 2014-02-10 DIAGNOSIS — Z3490 Encounter for supervision of normal pregnancy, unspecified, unspecified trimester: Secondary | ICD-10-CM

## 2014-02-10 DIAGNOSIS — O09529 Supervision of elderly multigravida, unspecified trimester: Secondary | ICD-10-CM

## 2014-02-10 DIAGNOSIS — Z3480 Encounter for supervision of other normal pregnancy, unspecified trimester: Secondary | ICD-10-CM

## 2014-02-10 DIAGNOSIS — IMO0002 Reserved for concepts with insufficient information to code with codable children: Secondary | ICD-10-CM

## 2014-02-10 DIAGNOSIS — O34219 Maternal care for unspecified type scar from previous cesarean delivery: Secondary | ICD-10-CM

## 2014-02-10 NOTE — Patient Instructions (Signed)
Lactancia materna (Breastfeeding) Decidir amamantar es una de las mejores elecciones que puede hacer por usted y su beb. El cambio hormonal durante el embarazo produce el desarrollo del tejido mamario y aumenta la cantidad y el tamao de los conductos galactforos. Estas hormonas tambin permiten que las protenas, los azcares y las grasas de la sangre produzcan la leche materna en las glndulas productoras de leche. Las hormonas impiden que la leche materna sea liberada antes del nacimiento del beb, adems de impulsar el flujo de leche luego del nacimiento. Una vez que ha comenzado a amamantar, pensar en el beb, as como la succin o el llanto, pueden estimular la liberacin de leche de las glndulas productoras de leche.  LOS BENEFICIOS DE AMAMANTAR Para el beb  La primera leche (calostro) ayuda al mejor funcionamiento del sistema digestivo del beb.  La leche tiene anticuerpos que ayudan a prevenir las infecciones en el beb.  El beb tiene una menor incidencia de asma, alergias y del sndrome de muerte sbita del lactante.  Los nutrientes en la leche materna son mejores para el beb que la leche maternizada y estn preparados exclusivamente para cubrir las necesidades del beb.  La leche materna mejora el desarrollo cerebral del beb.  Es menos probable que el beb desarrolle otras enfermedades, como obesidad infantil, asma o diabetes mellitus de tipo 2. Para usted   La lactancia materna favorece el desarrollo de un vnculo muy especial entre la madre y el beb.  Es conveniente. Siempre est disponible a la temperatura correcta y es econmica.  La lactancia materna ayuda a quemar caloras y a perder el peso ganado durante el embarazo.  Favorece la contraccin del tero al tamao que tena antes del embarazo de manera ms rpida y disminuye el sangrado (loquios) despus del parto.  La lactancia materna contribuye a reducir el riesgo de desarrollar diabetes mellitus de tipo 2,  osteoporosis o cncer de mama o de ovario en el futuro. SIGNOS DE QUE EL BEB EST HAMBRIENTO Primeros signos de hambre  Aumenta su estado de alerta o actividad.  Se estira.  Mueve la cabeza de un lado a otro.  Mueve la cabeza y abre la boca cuando se le toca la mejilla o la comisura de la boca (reflejo de bsqueda).  Aumenta las vocalizaciones, tales como sonidos de succin, se relame los labios, emite arrullos, suspiros, o chirridos.  Mueve la mano hacia la boca.  Se chupa con ganas los dedos o las manos. Signos tardos de hambre  Est agitado.  Llora de manera intermitente. Signos de hambre extrema Los signos de hambre extrema requerirn que lo calme y lo consuele antes de que el beb pueda alimentarse adecuadamente. No espere a que se manifiesten los siguientes signos de hambre extrema para comenzar a amamantar:   Agitacin.  Llanto intenso y fuerte.   Gritos. INFORMACIN BSICA SOBRE LA LACTANCIA MATERNA Iniciacin de la lactancia materna  Encuentre un lugar cmodo para sentarse o acostarse, con un buen respaldo para el cuello y la espalda.  Coloque una almohada o una manta enrollada debajo del beb para acomodarlo a la altura de la mama (si est sentada). Las almohadas para amamantar se han diseado especialmente a fin de servir de apoyo para los brazos y el beb mientras amamanta.  Asegrese de que el abdomen del beb est frente al suyo.  Masajee suavemente la mama. Con las yemas de los dedos, masajee la pared del pecho hacia el pezn en un movimiento circular. Esto estimula el flujo   de leche. Es posible que deba continuar este movimiento mientras amamanta si la leche fluye lentamente.  Sostenga la mama con el pulgar por arriba del pezn y los otros 4 dedos por debajo de la mama. Asegrese de que los dedos se encuentren lejos del pezn y de la boca del beb.  Empuje suavemente los labios del beb con el pezn o con el dedo.  Cuando la boca del beb se abra lo  suficiente, acrquelo rpidamente a la mama e introduzca todo el pezn y la zona oscura que lo rodea (areola), tanto como sea posible, dentro de la boca del beb.  Debe haber ms areola visible por arriba del labio superior del beb que por debajo del labio inferior.  La lengua del beb debe estar entre la enca inferior y la mama.  Asegrese de que la boca del beb est en la posicin correcta alrededor del pezn (prendida). Los labios del beb deben crear un sello sobre la mama, doblndose hacia afuera (invertidos).  Es comn que el beb succione durante 2 a 3 minutos para que comience el flujo de leche materna. Cmo debe prenderse Es muy importante que le ensee al beb cmo prenderse adecuadamente a la mama. Si el beb no se prende adecuadamente, puede causarle dolor en el pezn y reducir la produccin de leche materna, y hacer que el beb tenga un escaso aumento de peso. Adems, si el beb no se prende adecuadamente al pezn, puede tragar aire durante la alimentacin. Esto puede causarle molestias al beb. Hacer eructar al beb al cambiar de mama puede ayudarlo a liberar el aire. Sin embargo, ensearle al beb cmo prenderse a la mama adecuadamente es la mejor manera de evitar que se sienta molesto por tragar aire mientras se alimenta. Signos de que el beb se ha prendido adecuadamente al pezn:   Tironea o succiona de modo silencioso, sin causarle dolor.  Se escucha que traga cada 3 o 4 succiones.   Hay movimientos musculares por arriba y por delante de sus odos al succionar. Signos de que el beb no se ha prendido adecuadamente al pezn:   Hace ruidos de succin o de chasquido mientras se alimenta.  Dolor en el pezn. Si cree que el beb no se prendi correctamente, deslice el dedo en la comisura de la boca y colquelo entre las encas del beb para interrumpir la succin. Intente comenzar a amamantar nuevamente. Signos de lactancia materna exitosa Signos del beb:   Disminucin  gradual en el nmero de succiones o cese completo de la succin.  Se duerme.  Relaja el cuerpo.  Retiene una pequea cantidad de leche en su boca.  Se desprende solo del pecho. Signos que presenta usted:  Las mamas han aumentado la firmeza, el peso y el tamao 1 a 3 horas despus de amamantar.  Estn ms blandas inmediatamente despus de amamantar.  Un aumento del volumen de leche, y tambin el cambio de su consistencia y color se producen hacia el quinto da de lactancia materna.  Los pezones no duelen, ni estn agrietados ni sangran. Signos de que su beb recibe la cantidad de leche suficiente  Moja al menos 3 paales en 24 horas. La orina debe ser clara y de color amarillo plido a los 5 das de vida.  Defeca al menos 3 veces en 24 horas a los 5 das de vida. La materia fecal debe ser blanda y amarillenta.  Defeca al menos 3 veces en 24 horas a los 7 das de vida. La   materia fecal debe ser grumosa y amarillenta.  No registra una prdida de peso mayor del 10% del peso al nacer durante los primeros 3 das de vida.  Aumenta de peso un promedio de 4 a 7onzas (120 a 210ml) por semana despus de los 4 das de vida.  Aumenta de peso, diariamente, de manera consistente a partir de los 5 das de vida, sin registrar prdida de peso despus de las 2 semanas de vida. Despus de alimentarse, es posible que el beb regurgite una pequea cantidad. Esto es frecuente. FRECUENCIA Y DURACIN DE LA LACTANCIA MATERNA El amamantamiento frecuente la ayudar a producir ms leche y a prevenir problemas de dolor en los pezones e hinchazn en las mamas. Alimente al beb cuando muestre signos de hambre o si siente la necesidad de reducir la congestin de las mamas. Esto se denomina "lactancia a demanda". Evite el uso del chupete mientras trabaja para establecer la lactancia (las primeras 4 a 6 semanas despus del nacimiento del beb). Despus de este perodo, podr ofrecerle un chupete. Las  investigaciones demostraron que el uso del chupete durante el primer ao de vida del beb disminuye el riesgo de desarrollar el sndrome de muerte sbita del lactante (SMSL). Permita que el nio se alimente en cada mama todo lo que desee. Contine amamantando al beb hasta que haya terminado de alimentarse. Cuando el beb se desprende o se queda dormido mientras se est alimentando de la primera mama, ofrzcale la segunda. Debido a que, con frecuencia, los recin nacidos permanecen somnolientos las primeras semanas de vida, es posible que deba despertar a su beb para alimentarlo. Los horarios de lactancia varan de un beb a otro. Sin embargo, las siguientes reglas pueden servir como gua para ayudarle a garantizar que el beb se alimenta adecuadamente:  Se puede amamantar a los recin nacidos (bebs de 4 semanas o menos de vida) cada 1 a 3 horas.  No deben transcurrir ms de 3 horas durante el da o 5 horas durante la noche sin que se amamante a los recin nacidos.  Debe amamantar al beb 8 veces como mnimo, en un perodo de 24 horas, hasta que comience a introducir slidos en su dieta, a los 6 meses de vida aproximadamente. EXTRACCIN DE LECHE MATERNA La extraccin y el almacenamiento de la leche materna le permiten asegurarse de que el beb se alimente exclusivamente de leche materna, aun en momentos en los que no puede amamantar. Esto tiene especial importancia si debe regresar al trabajo en el perodo en que an est amamantando o si no puede estar presente en los momentos en que el beb debe alimentarse. Su asesor en lactancia puede orientarla sobre cunto tiempo es seguro almacenar leche materna.  El sacaleche es un aparato que le permite extraer leche de la mama a un recipiente estril. Luego, la leche materna extrada puede almacenarse en un refrigerador o freezer. Algunos sacaleches son manuales, mientras que otros son elctricos. Consulte a su asesor en lactancia qu tipo ser ms conveniente  para usted. Los sacaleches se pueden comprar, sin embargo, algunos hospitales y grupos de apoyo a la lactancia materna alquilan sacaleches mensualmente. Un asesor en lactancia puede ensearle cmo extraer leche materna manualmente, en caso de que prefiera no usar un sacaleche.  CMO CUIDAR LAS MAMAS DURANTE LA LACTANCIA MATERNA Los pezones se secan, agrietan y duelen durante la lactancia materna. Las siguientes recomendaciones pueden ayudarle a mantener las mamas humectadas y sanas:  Evite usar jabn en los pezones.  Use un sostn   de soporte. Aunque no son esenciales, las camisetas sin mangas o los sostenes especiales para amamantar estn diseados para acceder fcilmente a las mamas, para amamantar sin tener que quitarse todo el sostn o la camiseta. Evite usar sostenes con aro o sostenes muy ajustados.  Seque al aire sus pezones durante 3 a 4minutos despus de amamantar al beb.  Utilice solo apsitos de algodn en el sostn para absorber las prdidas de leche. La prdida de un poco de leche materna entre las tomas es normal.  Utilice lanolina sobre los pezones luego de amamantar. La lanolina ayuda a mantener la humedad normal de la piel. Si usa lanolina pura, no tiene que lavarse los pezones antes de alimentar al beb. La lanolina pura no es txica para el beb. Adems, puede extraer manualmente algunas gotas de leche materna y masajear suavemente esa leche sobre los pezones, para que la leche se seque al aire. Durante las primeras semanas despus de dar a luz, algunas mujeres pueden experimentar hinchazn en las mamas (congestin mamaria). La congestin puede hacer que sienta las mamas pesadas, calientes y sensibles al tacto. El pico de la congestin ocurre dentro de los 3 a 5 das despus del parto. Las siguientes recomendaciones pueden ayudarle a aliviar la congestin:  Vace por completo las mamas al amamantar o extraer leche. Puede aplicar calor hmedo en las mamas (en la ducha o con toallas  hmedas para manos) antes de amamantar o extraer leche. Esto aumenta la circulacin y ayuda a que la leche fluya. Si el beb no vaca por completo las mamas cuando lo amamanta, extraiga la leche restante despus de que haya finalizado.  Use un sostn ajustado (para amamantar o comn) o camiseta sin mangas durante 1 o 2 das para indicar al cuerpo que disminuya ligeramente la produccin de leche.  Aplique compresas de hielo sobre las mamas, a menos que le resulte demasiado incmodo.  Asegrese de que el beb se encuentre en la posicin correcta mientras lo alimenta. Si la congestin persiste luego de 48 horas o despus de seguir estas recomendaciones, comunquese con su mdico o un asesor en lactancia. RECOMENDACIONES GENERALES PARA EL CUIDADO DE LA SALUD DURANTE LA LACTANCIA MATERNA  Consuma alimentos saludables. Alterne comidas y colaciones, comiendo 3 de cada una por da. Dado que lo que come afecta la leche materna, es posible que algunas comidas hagan que su beb se vuelva ms irritable de lo habitual. Evite comer este tipo de alimentos, si percibe que afectan de manera negativa al beb.  Beba leche, jugos de fruta y agua para satisfacer su sed (aproximadamente 10 vasos al da).  Descanse con frecuencia, reljese y tome sus vitaminas prenatales para evitar la fatiga, el estrs y la anemia.  Contine con los autocontroles de la mama.  Evite masticar y fumar tabaco.  Evite el consumo de alcohol y drogas. Algunos medicamentos, que pueden ser perjudiciales para el beb, pueden pasar a travs de la leche materna. Es importante que consulte a su mdico antes de tomar cualquier medicamento, incluidos todos los medicamentos recetados y de venta libre, as como los suplementos vitamnicos y herbales. Puede quedar embarazada durante la lactancia. Si desea controlar la natalidad, consulte a su mdico cules son las opciones ms seguras para el beb. SOLICITE ATENCIN MDICA SI:   Usted siente que  quiere dejar de amamantar o se siente frustrada con la lactancia.  Siente dolor en las mamas o en los pezones.  Sus pezones estn agrietados o sangran.  Sus pechos estn irritados,   sensibles o calientes.  Tiene un rea hinchada en cualquiera de las mamas.  Siente escalofros o fiebre.  Tiene nuseas o vmitos.  Presenta una secrecin de otro lquido distinto de la leche materna de los pezones.  Sus mamas no se llenan antes de amamantar al beb para el 5. da despus del parto.  Se siente triste y deprimida.  El beb est demasiado somnoliento como para comer bien.  El beb tiene problemas para dormir.  Moja menos de 3 paales en 24 horas.  Defeca menos de 3 veces en 24 horas.  La piel del beb o la parte blanca de sus ojos est amarilla.  El beb no ha aumentado de peso a los 5 das de vida. SOLICITE ATENCIN MDICA DE INMEDIATO SI:   El beb est muy cansado (aletargado) y no se despierta para comer.  Le sube la fiebre sin causa. Document Released: 10/30/2005 Document Revised: 02/24/2013 [employer] Patient Information 2014 [employer].  

## 2014-02-10 NOTE — Progress Notes (Signed)
Good FM.  Labor precautions. For RCS and BTL.

## 2014-02-10 NOTE — Progress Notes (Signed)
[id number]  

## 2014-02-11 ENCOUNTER — Encounter: Payer: Self-pay | Admitting: Obstetrics & Gynecology

## 2014-02-13 ENCOUNTER — Encounter (HOSPITAL_COMMUNITY): Payer: Self-pay

## 2014-02-16 ENCOUNTER — Encounter (HOSPITAL_COMMUNITY)
Admission: RE | Admit: 2014-02-16 | Discharge: 2014-02-16 | Disposition: A | Discharge status: Home / Self Care | Payer: Medicaid Other | Source: Ambulatory Visit | Attending: Obstetrics & Gynecology | Admitting: Obstetrics & Gynecology

## 2014-02-16 ENCOUNTER — Encounter (HOSPITAL_COMMUNITY): Payer: Self-pay

## 2014-02-16 VITALS — BP 93/71 | HR 71 | Resp 18 | Ht <= 58 in | Wt 130.0 lb

## 2014-02-16 DIAGNOSIS — Z3490 Encounter for supervision of normal pregnancy, unspecified, unspecified trimester: Secondary | ICD-10-CM

## 2014-02-16 DIAGNOSIS — IMO0002 Reserved for concepts with insufficient information to code with codable children: Secondary | ICD-10-CM

## 2014-02-16 DIAGNOSIS — Z302 Encounter for sterilization: Secondary | ICD-10-CM

## 2014-02-16 DIAGNOSIS — O34219 Maternal care for unspecified type scar from previous cesarean delivery: Secondary | ICD-10-CM

## 2014-02-16 DIAGNOSIS — Z01812 Encounter for preprocedural laboratory examination: Secondary | ICD-10-CM | POA: Insufficient documentation

## 2014-02-16 HISTORY — DX: Other specified health status: Z78.9

## 2014-02-16 LAB — TYPE AND SCREEN
ABO/RH(D): A POS
ANTIBODY SCREEN: NEGATIVE

## 2014-02-16 LAB — ABO/RH: ABO/RH(D): A POS

## 2014-02-16 LAB — CBC
HCT: 35.8 % — ABNORMAL LOW (ref 36.0–46.0)
Hemoglobin: 12.7 g/dL (ref 12.0–15.0)
MCH: 33.8 pg (ref 26.0–34.0)
MCHC: 35.5 g/dL (ref 30.0–36.0)
MCV: 95.2 fL (ref 78.0–100.0)
PLATELETS: 126 10*3/uL — AB (ref 150–400)
RBC: 3.76 MIL/uL — ABNORMAL LOW (ref 3.87–5.11)
RDW: 14.4 % (ref 11.5–15.5)
WBC: 5.9 10*3/uL (ref 4.0–10.5)

## 2014-02-16 LAB — RPR: RPR: NONREACTIVE

## 2014-02-16 NOTE — Patient Instructions (Signed)
20 Tamanika Maya-Ruiz  02/16/2014   Your procedure is scheduled on:  02/18/14  [facility]   Pick up the phone at the desk and dial [phone].   Call this number if you have problems the morning of surgery: [phone]   Remember:   Do not eat food:After Midnight.  Do not drink clear liquids: 4 Hours before arrival.  Take these medicines the morning of surgery with A SIP OF WATER: NA   Do not wear jewelry, make-up or nail polish.  Do not wear lotions, powders, or perfumes. You may wear deodorant.  Do not shave 48 hours prior to surgery.  Do not bring valuables to the hospital.  [facility] is not   responsible for any belongings or valuables brought to the hospital.  Contacts, dentures or bridgework may not be worn into surgery.  Leave suitcase in the car. After surgery it may be brought to your room.  For patients admitted to the hospital, checkout time is 11:00 AM the day of              discharge.   Patients discharged the day of surgery will not be allowed to drive             home.  Name and phone number of your driver: NA  Special Instructions:      Please read over the following fact sheets that you were given:   Surgical Site Infection Prevention

## 2014-02-17 ENCOUNTER — Ambulatory Visit (INDEPENDENT_AMBULATORY_CARE_PROVIDER_SITE_OTHER): Payer: Self-pay | Admitting: Family Medicine

## 2014-02-17 VITALS — BP 118/71 | Wt 133.0 lb

## 2014-02-17 DIAGNOSIS — Z3490 Encounter for supervision of normal pregnancy, unspecified, unspecified trimester: Secondary | ICD-10-CM

## 2014-02-17 DIAGNOSIS — Z3480 Encounter for supervision of other normal pregnancy, unspecified trimester: Secondary | ICD-10-CM

## 2014-02-17 DIAGNOSIS — O34219 Maternal care for unspecified type scar from previous cesarean delivery: Secondary | ICD-10-CM

## 2014-02-17 NOTE — Progress Notes (Signed)
Doing well--scheduled for CS and BTL tomorrow.

## 2014-02-17 NOTE — Patient Instructions (Signed)
Tercer trimestre del embarazo  (Third Trimester of Pregnancy) El tercer trimestre del embarazo abarca desde la semana 29 hasta la semana 42, desde el 7 mes hasta el 9. En este trimestre el feto se desarrolla muy rpidamente. Hacia el final del noveno mes, el beb que an no ha nacido mide alrededor de 20 pulgadas (45 cm) de largo y pesa entre 6 y 10 libras (2,700 y 4,500 kg).  CAMBIOS CORPORALES  Su organismo atravesar numerosos cambios durante el embarazo. Los cambios varan de una mujer a otra.   Seguir aumentando de peso. Es esperable que aumente entre 25 y 35 libras (11 16 kg) hacia el final del embarazo.  Podrn aparecer las primeras estras en las caderas, abdomen y mamas.  Tendr necesidad de orinar con ms frecuencia porque el feto baja hacia la pelvis y presiona en la vejiga.  Como consecuencia del embarazo, podr sentir acidez estomacal continuamente.  Podr estar constipada ya que ciertas hormonas hacen que los msculos que hacen progresar los desechos a travs de los intestinos trabajen ms lentamente.  Pueden aparecer hemorroides o abultarse e hincharse las venas (venas varicosas).  Podr sentir dolor plvico debido al aumento de peso ya que las hormonas del embarazo relajan las articulaciones entre los huesos de la pelvis. El dolor de espalda puede ser consecuencia de la exigencia de los msculos que soportan la postura.  Sus mamas seguirn desarrollndose y estarn ms sensibles. A veces sale una secrecin amarilla de las mamas, que se llama calostro.  El ombligo puede salir hacia afuera.  Podr sentir que le falta el aire debido a que se expande el tero.  Podr notar que el feto "baja" o que se siente ms bajo en el abdomen.  Podr tener una prdida de secrecin mucosa con sangre. Esto suele ocurrir entre unos pocos das y una semana antes del parto.  El cuello se vuelve delgado y blando (se borra) cerca de la fecha de parto. QU DEBE ESPERAR EN LAS CONSULTAS  PRENATALES  Le harn exmenes prenatales cada 2 semanas hasta la semana 36. A partir de ese momento le harn exmenes semanales. Durante una visita prenatal de rutina:   La pesarn para verificar que usted y el feto se encuentran dentro de los lmites normales.  Le tomarn la presin arterial.  Le medirn el abdomen para verificar el desarrollo del beb.  Escucharn los latidos fetales.  Se evaluarn los resultados de los estudios solicitados en visitas anteriores.  Le controlarn el cuello del tero cuando est prxima la fecha de parto para ver si se ha borrado. Alrededor de la semana 36 el mdico controlar el cuello del tero. Al mismo tiempo realizar un anlisis de las secreciones del tejido vaginal. Este examen es para determinar si hay un tipo de bacteria, estreptococo Grupo B. El mdico le explicar esto con ms detalle.  El mdico podr preguntarle:   Como le gustara que fuera el parto.  Cmo se siente.  Si siente los movimientos del beb.  Si tiene sntomas anormales, como prdida de lquido, sangrado, dolores de cabeza intenso o clicos abdominales.  Si tiene alguna duda. Otros estudios que podrn realizarse durante el tercer trimestre son:   Anlisis de sangre para controlar sus niveles de hierro (anemia).  Controles fetales para determinar su salud, el nivel de actividad y su desarrollo. Si tiene alguna enfermedad o si tuvo problemas durante el embarazo, le harn estudios. FALSO TRABAJO DE PARTO  Es posible que sienta contracciones pequeas e irregulares que finalmente   desaparecen. Se llaman contracciones de Braxton Hicks o falso trabajo de parto. Las contracciones pueden durar horas, das o an semanas antes de que el verdadero trabajo de parto se inicie. Si las contracciones tienen intervalos regulares, se intensifican o se hacen dolorosas, lo mejor es que la revise su mdico.  SIGNOS DE TRABAJO DE PARTO   Espasmos del tipo menstrual.  Contracciones cada 5  minutos o menos.  Contracciones que comienzan en la parte superior del tero y se expanden hacia abajo, a la zona inferior del abdomen y la espalda.  Sensacin de presin que aumenta en la pelvis o dolor en la espalda.  Aparece una secrecin acuosa o sanguinolenta por la vagina. Si tiene alguno de estos signos antes de la semana 37 del embarazo, llame a su mdico inmediatamente. Debe concurrir al hospital para ser controlada inmediatamente.  INSTRUCCIONES PARA EL CUIDADO EN EL HOGAR   Evite fumar, consumir hierbas, beber alcohol y utilizar frmacos que no le hayan recetado. Estas sustancias qumicas afectan la formacin y el desarrollo del beb.  Siga las indicaciones del profesional con respecto a como tomar los medicamentos. Durante el embarazo, hay medicamentos que son seguros y otros no lo son.  Realice actividad fsica slo segn las indicaciones del mdico. Sentir clicos uterinos es el mejor signo para detener la actividad fsica.  Contine haciendo comidas regulares y sanas.  Use un sostn que le brinde buen soporte si sus mamas estn sensibles.  No utilice la baera con agua caliente, baos turcos o saunas.  Colquese el cinturn de seguridad cuando conduzca.  Evite comer carne cruda queso sin cocinar y el contacto con los utensilios y desperdicios de los gatos. Estos elementos contienen grmenes que pueden causar defectos de nacimiento en el beb.  Tome las vitaminas indicadas para la etapa prenatal.  Pruebe un laxante (si el mdico la autoriza) si tiene constipacin. Consuma ms alimentos ricos en fibra, como vegetales y frutas frescos y cereales enteros. Beba gran cantidad de lquido para mantener la orina de tono claro o amarillo plido.  Tome baos de agua tibia para calmar el dolor o las molestias causadas por las hemorroides. Use una crema para las hemorroides si el mdico la autoriza.  Si tiene venas varicosas, use medias de soporte. Eleve los pies durante 15 minutos,  3 4 veces por da. Limite el consumo de sal en su dieta.  Evite levantar objetos pesados, use zapatos de tacones bajos y mantenga una buena postura.  Descanse con las piernas elevadas si tiene calambres o dolor de cintura.  Visite a su dentista si no lo ha hecho durante el embarazo. Use un cepillo de dientes blando para higienizarse los dientes y use suavemente el hilo dental.  Puede continuar su vida sexual excepto que el mdico le indique otra cosa.  No haga viajes largos excepto que sea absolutamente necesario y slo con la aprobacin de su mdico.  Tome clases prenatales para entender, practicar y hacer preguntas sobre el trabajo de parto y el alumbramiento.  Haga un ensayo sobre la partida al hospital.  Prepare el bolso que llevar al hospital.  Prepare la habitacin del beb.  Contine concurriendo a todas las visitas prenatales segn las indicaciones de su mdico. SOLICITE ATENCIN MDICA SI:   No est segura si est en trabajo de parto o ha roto la bolsa de aguas.  Tiene mareos.  Siente clicos leves, presin en la pelvis o dolor persistente en el abdomen.  Tiene nuseas o vmitos persistentes.  Observa una   secrecin vaginal con mal olor.  Siente dolor al orinar. SOLICITE ATENCIN MDICA DE INMEDIATO SI:   Tiene fiebre.  Pierde lquido o sangre por la vagina.  Tiene sangrado o pequeas prdidas vaginales.  Siente dolor intenso o clicos en el abdomen.  Sube o baja de peso rpidamente.  Le falta el aire y le duele el pecho al respirar.  Sbitamente se le hincha el rostro, las manos, los tobillos, los pies o las piernas de manera extrema.  No ha sentido los movimientos del beb durante una hora.  Siente un dolor de cabeza intenso que no se alivia con medicamentos.  Su visin se modifica. Document Released: 08/09/2005 Document Revised: 07/02/2013 [employer] Patient Information 2014 [employer].  Lactancia materna (Breastfeeding) Decidir amamantar es  una de las mejores elecciones que puede hacer por usted y su beb. El cambio hormonal durante el embarazo produce el desarrollo del tejido mamario y aumenta la cantidad y el tamao de los conductos galactforos. Estas hormonas tambin permiten que las protenas, los azcares y las grasas de la sangre produzcan la leche materna en las glndulas productoras de leche. Las hormonas impiden que la leche materna sea liberada antes del nacimiento del beb, adems de impulsar el flujo de leche luego del nacimiento. Una vez que ha comenzado a amamantar, pensar en el beb, as como la succin o el llanto, pueden estimular la liberacin de leche de las glndulas productoras de leche.  LOS BENEFICIOS DE AMAMANTAR Para el beb  La primera leche (calostro) ayuda al mejor funcionamiento del sistema digestivo del beb.  La leche tiene anticuerpos que ayudan a prevenir las infecciones en el beb.  El beb tiene una menor incidencia de asma, alergias y del sndrome de muerte sbita del lactante.  Los nutrientes en la leche materna son mejores para el beb que la leche maternizada y estn preparados exclusivamente para cubrir las necesidades del beb.  La leche materna mejora el desarrollo cerebral del beb.  Es menos probable que el beb desarrolle otras enfermedades, como obesidad infantil, asma o diabetes mellitus de tipo 2. Para usted   La lactancia materna favorece el desarrollo de un vnculo muy especial entre la madre y el beb.  Es conveniente. Siempre est disponible a la temperatura correcta y es econmica.  La lactancia materna ayuda a quemar caloras y a perder el peso ganado durante el embarazo.  Favorece la contraccin del tero al tamao que tena antes del embarazo de manera ms rpida y disminuye el sangrado (loquios) despus del parto.  La lactancia materna contribuye a reducir el riesgo de desarrollar diabetes mellitus de tipo 2, osteoporosis o cncer de mama o de ovario en el  futuro. SIGNOS DE QUE EL BEB EST HAMBRIENTO Primeros signos de hambre  Aumenta su estado de alerta o actividad.  Se estira.  Mueve la cabeza de un lado a otro.  Mueve la cabeza y abre la boca cuando se le toca la mejilla o la comisura de la boca (reflejo de bsqueda).  Aumenta las vocalizaciones, tales como sonidos de succin, se relame los labios, emite arrullos, suspiros, o chirridos.  Mueve la mano hacia la boca.  Se chupa con ganas los dedos o las manos. Signos tardos de hambre  Est agitado.  Llora de manera intermitente. Signos de hambre extrema Los signos de hambre extrema requerirn que lo calme y lo consuele antes de que el beb pueda alimentarse adecuadamente. No espere a que se manifiesten los siguientes signos de hambre extrema para comenzar a   amamantar:   Agitacin.  Llanto intenso y fuerte.   Gritos. INFORMACIN BSICA SOBRE LA LACTANCIA MATERNA Iniciacin de la lactancia materna  Encuentre un lugar cmodo para sentarse o acostarse, con un buen respaldo para el cuello y la espalda.  Coloque una almohada o una manta enrollada debajo del beb para acomodarlo a la altura de la mama (si est sentada). Las almohadas para amamantar se han diseado especialmente a fin de servir de apoyo para los brazos y el beb mientras amamanta.  Asegrese de que el abdomen del beb est frente al suyo.  Masajee suavemente la mama. Con las yemas de los dedos, masajee la pared del pecho hacia el pezn en un movimiento circular. Esto estimula el flujo de leche. Es posible que deba continuar este movimiento mientras amamanta si la leche fluye lentamente.  Sostenga la mama con el pulgar por arriba del pezn y los otros 4 dedos por debajo de la mama. Asegrese de que los dedos se encuentren lejos del pezn y de la boca del beb.  Empuje suavemente los labios del beb con el pezn o con el dedo.  Cuando la boca del beb se abra lo suficiente, acrquelo rpidamente a la mama e  introduzca todo el pezn y la zona oscura que lo rodea (areola), tanto como sea posible, dentro de la boca del beb.  Debe haber ms areola visible por arriba del labio superior del beb que por debajo del labio inferior.  La lengua del beb debe estar entre la enca inferior y la mama.  Asegrese de que la boca del beb est en la posicin correcta alrededor del pezn (prendida). Los labios del beb deben crear un sello sobre la mama, doblndose hacia afuera (invertidos).  Es comn que el beb succione durante 2 a 3 minutos para que comience el flujo de leche materna. Cmo debe prenderse Es muy importante que le ensee al beb cmo prenderse adecuadamente a la mama. Si el beb no se prende adecuadamente, puede causarle dolor en el pezn y reducir la produccin de leche materna, y hacer que el beb tenga un escaso aumento de peso. Adems, si el beb no se prende adecuadamente al pezn, puede tragar aire durante la alimentacin. Esto puede causarle molestias al beb. Hacer eructar al beb al cambiar de mama puede ayudarlo a liberar el aire. Sin embargo, ensearle al beb cmo prenderse a la mama adecuadamente es la mejor manera de evitar que se sienta molesto por tragar aire mientras se alimenta. Signos de que el beb se ha prendido adecuadamente al pezn:   Tironea o succiona de modo silencioso, sin causarle dolor.  Se escucha que traga cada 3 o 4 succiones.   Hay movimientos musculares por arriba y por delante de sus odos al succionar. Signos de que el beb no se ha prendido adecuadamente al pezn:   Hace ruidos de succin o de chasquido mientras se alimenta.  Dolor en el pezn. Si cree que el beb no se prendi correctamente, deslice el dedo en la comisura de la boca y colquelo entre las encas del beb para interrumpir la succin. Intente comenzar a amamantar nuevamente. Signos de lactancia materna exitosa Signos del beb:   Disminucin gradual en el nmero de succiones o cese  completo de la succin.  Se duerme.  Relaja el cuerpo.  Retiene una pequea cantidad de leche en su boca.  Se desprende solo del pecho. Signos que presenta usted:  Las mamas han aumentado la firmeza, el peso y el tamao   1 a 3 horas despus de amamantar.  Estn ms blandas inmediatamente despus de amamantar.  Un aumento del volumen de leche, y tambin el cambio de su consistencia y color se producen hacia el quinto da de lactancia materna.  Los pezones no duelen, ni estn agrietados ni sangran. Signos de que su beb recibe la cantidad de leche suficiente  Moja al menos 3 paales en 24 horas. La orina debe ser clara y de color amarillo plido a los 5 das de vida.  Defeca al menos 3 veces en 24 horas a los 5 das de vida. La materia fecal debe ser blanda y amarillenta.  Defeca al menos 3 veces en 24 horas a los 7 das de vida. La materia fecal debe ser grumosa y amarillenta.  No registra una prdida de peso mayor del 10% del peso al nacer durante los primeros 3 das de vida.  Aumenta de peso un promedio de 4 a 7onzas (120 a 210ml) por semana despus de los 4 das de vida.  Aumenta de peso, diariamente, de manera consistente a partir de los 5 das de vida, sin registrar prdida de peso despus de las 2 semanas de vida. Despus de alimentarse, es posible que el beb regurgite una pequea cantidad. Esto es frecuente. FRECUENCIA Y DURACIN DE LA LACTANCIA MATERNA El amamantamiento frecuente la ayudar a producir ms leche y a prevenir problemas de dolor en los pezones e hinchazn en las mamas. Alimente al beb cuando muestre signos de hambre o si siente la necesidad de reducir la congestin de las mamas. Esto se denomina "lactancia a demanda". Evite el uso del chupete mientras trabaja para establecer la lactancia (las primeras 4 a 6 semanas despus del nacimiento del beb). Despus de este perodo, podr ofrecerle un chupete. Las investigaciones demostraron que el uso del chupete  durante el primer ao de vida del beb disminuye el riesgo de desarrollar el sndrome de muerte sbita del lactante (SMSL). Permita que el nio se alimente en cada mama todo lo que desee. Contine amamantando al beb hasta que haya terminado de alimentarse. Cuando el beb se desprende o se queda dormido mientras se est alimentando de la primera mama, ofrzcale la segunda. Debido a que, con frecuencia, los recin nacidos permanecen somnolientos las primeras semanas de vida, es posible que deba despertar a su beb para alimentarlo. Los horarios de lactancia varan de un beb a otro. Sin embargo, las siguientes reglas pueden servir como gua para ayudarle a garantizar que el beb se alimenta adecuadamente:  Se puede amamantar a los recin nacidos (bebs de 4 semanas o menos de vida) cada 1 a 3 horas.  No deben transcurrir ms de 3 horas durante el da o 5 horas durante la noche sin que se amamante a los recin nacidos.  Debe amamantar al beb 8 veces como mnimo, en un perodo de 24 horas, hasta que comience a introducir slidos en su dieta, a los 6 meses de vida aproximadamente. EXTRACCIN DE LECHE MATERNA La extraccin y el almacenamiento de la leche materna le permiten asegurarse de que el beb se alimente exclusivamente de leche materna, aun en momentos en los que no puede amamantar. Esto tiene especial importancia si debe regresar al trabajo en el perodo en que an est amamantando o si no puede estar presente en los momentos en que el beb debe alimentarse. Su asesor en lactancia puede orientarla sobre cunto tiempo es seguro almacenar leche materna.  El sacaleche es un aparato que le permite extraer leche   de la mama a un recipiente estril. Luego, la leche materna extrada puede almacenarse en un refrigerador o freezer. Algunos sacaleches son manuales, mientras que otros son elctricos. Consulte a su asesor en lactancia qu tipo ser ms conveniente para usted. Los sacaleches se pueden comprar, sin  embargo, algunos hospitales y grupos de apoyo a la lactancia materna alquilan sacaleches mensualmente. Un asesor en lactancia puede ensearle cmo extraer leche materna manualmente, en caso de que prefiera no usar un sacaleche.  CMO CUIDAR LAS MAMAS DURANTE LA LACTANCIA MATERNA Los pezones se secan, agrietan y duelen durante la lactancia materna. Las siguientes recomendaciones pueden ayudarle a mantener las mamas humectadas y sanas:  Evite usar jabn en los pezones.  Use un sostn de soporte. Aunque no son esenciales, las camisetas sin mangas o los sostenes especiales para amamantar estn diseados para acceder fcilmente a las mamas, para amamantar sin tener que quitarse todo el sostn o la camiseta. Evite usar sostenes con aro o sostenes muy ajustados.  Seque al aire sus pezones durante 3 a 4minutos despus de amamantar al beb.  Utilice solo apsitos de algodn en el sostn para absorber las prdidas de leche. La prdida de un poco de leche materna entre las tomas es normal.  Utilice lanolina sobre los pezones luego de amamantar. La lanolina ayuda a mantener la humedad normal de la piel. Si usa lanolina pura, no tiene que lavarse los pezones antes de alimentar al beb. La lanolina pura no es txica para el beb. Adems, puede extraer manualmente algunas gotas de leche materna y masajear suavemente esa leche sobre los pezones, para que la leche se seque al aire. Durante las primeras semanas despus de dar a luz, algunas mujeres pueden experimentar hinchazn en las mamas (congestin mamaria). La congestin puede hacer que sienta las mamas pesadas, calientes y sensibles al tacto. El pico de la congestin ocurre dentro de los 3 a 5 das despus del parto. Las siguientes recomendaciones pueden ayudarle a aliviar la congestin:  Vace por completo las mamas al amamantar o extraer leche. Puede aplicar calor hmedo en las mamas (en la ducha o con toallas hmedas para manos) antes de amamantar o extraer  leche. Esto aumenta la circulacin y ayuda a que la leche fluya. Si el beb no vaca por completo las mamas cuando lo amamanta, extraiga la leche restante despus de que haya finalizado.  Use un sostn ajustado (para amamantar o comn) o camiseta sin mangas durante 1 o 2 das para indicar al cuerpo que disminuya ligeramente la produccin de leche.  Aplique compresas de hielo sobre las mamas, a menos que le resulte demasiado incmodo.  Asegrese de que el beb se encuentre en la posicin correcta mientras lo alimenta. Si la congestin persiste luego de 48 horas o despus de seguir estas recomendaciones, comunquese con su mdico o un asesor en lactancia. RECOMENDACIONES GENERALES PARA EL CUIDADO DE LA SALUD DURANTE LA LACTANCIA MATERNA  Consuma alimentos saludables. Alterne comidas y colaciones, comiendo 3 de cada una por da. Dado que lo que come afecta la leche materna, es posible que algunas comidas hagan que su beb se vuelva ms irritable de lo habitual. Evite comer este tipo de alimentos, si percibe que afectan de manera negativa al beb.  Beba leche, jugos de fruta y agua para satisfacer su sed (aproximadamente 10 vasos al da).  Descanse con frecuencia, reljese y tome sus vitaminas prenatales para evitar la fatiga, el estrs y la anemia.  Contine con los autocontroles de la mama.    Evite masticar y fumar tabaco.  Evite el consumo de alcohol y drogas. Algunos medicamentos, que pueden ser perjudiciales para el beb, pueden pasar a travs de la leche materna. Es importante que consulte a su mdico antes de tomar cualquier medicamento, incluidos todos los medicamentos recetados y de venta libre, as como los suplementos vitamnicos y herbales. Puede quedar embarazada durante la lactancia. Si desea controlar la natalidad, consulte a su mdico cules son las opciones ms seguras para el beb. SOLICITE ATENCIN MDICA SI:   Usted siente que quiere dejar de amamantar o se siente frustrada  con la lactancia.  Siente dolor en las mamas o en los pezones.  Sus pezones estn agrietados o sangran.  Sus pechos estn irritados, sensibles o calientes.  Tiene un rea hinchada en cualquiera de las mamas.  Siente escalofros o fiebre.  Tiene nuseas o vmitos.  Presenta una secrecin de otro lquido distinto de la leche materna de los pezones.  Sus mamas no se llenan antes de amamantar al beb para el 5. da despus del parto.  Se siente triste y deprimida.  El beb est demasiado somnoliento como para comer bien.  El beb tiene problemas para dormir.  Moja menos de 3 paales en 24 horas.  Defeca menos de 3 veces en 24 horas.  La piel del beb o la parte blanca de sus ojos est amarilla.  El beb no ha aumentado de peso a los 5 das de vida. SOLICITE ATENCIN MDICA DE INMEDIATO SI:   El beb est muy cansado (aletargado) y no se despierta para comer.  Le sube la fiebre sin causa. Document Released: 10/30/2005 Document Revised: 02/24/2013 [employer] Patient Information 2014 [employer].  

## 2014-02-17 NOTE — Progress Notes (Signed)
[id number] 

## 2014-02-18 ENCOUNTER — Inpatient Hospital Stay (HOSPITAL_COMMUNITY): Payer: Medicaid Other | Admitting: Anesthesiology

## 2014-02-18 ENCOUNTER — Encounter (HOSPITAL_COMMUNITY): Payer: Medicaid Other | Admitting: Anesthesiology

## 2014-02-18 ENCOUNTER — Inpatient Hospital Stay (HOSPITAL_COMMUNITY)
Admission: RE | Admit: 2014-02-18 | Payer: Medicaid Other | Source: Ambulatory Visit | Admitting: Obstetrics & Gynecology

## 2014-02-18 ENCOUNTER — Encounter (HOSPITAL_COMMUNITY)
Admission: RE | Disposition: A | Discharge status: Home / Self Care | Payer: Self-pay | Source: Ambulatory Visit | Attending: Family Medicine

## 2014-02-18 ENCOUNTER — Encounter (HOSPITAL_COMMUNITY): Admission: RE | Payer: Self-pay | Source: Ambulatory Visit

## 2014-02-18 ENCOUNTER — Inpatient Hospital Stay (HOSPITAL_COMMUNITY)
Admission: RE | Admit: 2014-02-18 | Discharge: 2014-02-20 | DRG: 766 | Disposition: A | Discharge status: Home / Self Care | Payer: Medicaid Other | Source: Ambulatory Visit | Attending: Family Medicine | Admitting: Family Medicine

## 2014-02-18 ENCOUNTER — Encounter (HOSPITAL_COMMUNITY): Payer: Self-pay | Admitting: *Deleted

## 2014-02-18 DIAGNOSIS — O09529 Supervision of elderly multigravida, unspecified trimester: Secondary | ICD-10-CM | POA: Diagnosis not present

## 2014-02-18 DIAGNOSIS — Z302 Encounter for sterilization: Secondary | ICD-10-CM | POA: Diagnosis not present

## 2014-02-18 DIAGNOSIS — Z98891 History of uterine scar from previous surgery: Secondary | ICD-10-CM

## 2014-02-18 DIAGNOSIS — Z9889 Other specified postprocedural states: Secondary | ICD-10-CM

## 2014-02-18 DIAGNOSIS — O34219 Maternal care for unspecified type scar from previous cesarean delivery: Secondary | ICD-10-CM | POA: Diagnosis not present

## 2014-02-18 DIAGNOSIS — IMO0002 Reserved for concepts with insufficient information to code with codable children: Secondary | ICD-10-CM

## 2014-02-18 DIAGNOSIS — Z3490 Encounter for supervision of normal pregnancy, unspecified, unspecified trimester: Secondary | ICD-10-CM

## 2014-02-18 SURGERY — Surgical Case
Anesthesia: Spinal | Site: Abdomen | Laterality: Bilateral

## 2014-02-18 SURGERY — Surgical Case
Anesthesia: Regional | Site: Abdomen

## 2014-02-18 SURGERY — Surgical Case
Anesthesia: *Unknown

## 2014-02-18 MED ORDER — LACTATED RINGERS IV SOLN
INTRAVENOUS | Status: DC
  Administered 2014-02-19: via INTRAVENOUS

## 2014-02-18 MED ORDER — ONDANSETRON HCL 4 MG/2ML IJ SOLN
INTRAMUSCULAR | Status: AC
  Filled 2014-02-18: qty 2

## 2014-02-18 MED ORDER — SCOPOLAMINE 1 MG/3DAYS TD PT72
MEDICATED_PATCH | TRANSDERMAL | Status: AC
  Administered 2014-02-18: 1.5 mg via TRANSDERMAL
  Filled 2014-02-18: qty 1

## 2014-02-18 MED ORDER — OXYTOCIN 10 UNIT/ML IJ SOLN
INTRAMUSCULAR | Status: AC
  Filled 2014-02-18: qty 4

## 2014-02-18 MED ORDER — MORPHINE SULFATE 0.5 MG/ML IJ SOLN
INTRAMUSCULAR | Status: AC
  Filled 2014-02-18: qty 10

## 2014-02-18 MED ORDER — FLEET ENEMA 7-19 GM/118ML RE ENEM: 1.0000 | ENEMA | Freq: Every day | RECTAL | Status: DC | PRN

## 2014-02-18 MED ORDER — TETANUS-DIPHTH-ACELL PERTUSSIS 5-2.5-18.5 LF-MCG/0.5 IM SUSP: 0.5000 mL | Freq: Once | INTRAMUSCULAR | Status: DC

## 2014-02-18 MED ORDER — SENNOSIDES-DOCUSATE SODIUM 8.6-50 MG PO TABS
2.0000 | ORAL_TABLET | ORAL | Status: DC
  Administered 2014-02-19 – 2014-02-20 (×2): 2 via ORAL
  Filled 2014-02-18 (×2): qty 2

## 2014-02-18 MED ORDER — DIBUCAINE 1 % RE OINT: 1.0000 "application " | TOPICAL_OINTMENT | RECTAL | Status: DC | PRN

## 2014-02-18 MED ORDER — SODIUM CHLORIDE 0.9 % IJ SOLN: 3.0000 mL | INTRAMUSCULAR | Status: DC | PRN

## 2014-02-18 MED ORDER — ZOLPIDEM TARTRATE 5 MG PO TABS
5.0000 mg | ORAL_TABLET | Freq: Every evening | ORAL | Status: DC | PRN
Start: 2014-02-18 — End: 2014-02-20

## 2014-02-18 MED ORDER — SCOPOLAMINE 1 MG/3DAYS TD PT72
1.0000 | MEDICATED_PATCH | Freq: Once | TRANSDERMAL | Status: DC
  Administered 2014-02-18: 1.5 mg via TRANSDERMAL

## 2014-02-18 MED ORDER — FENTANYL CITRATE 0.05 MG/ML IJ SOLN: 25.0000 ug | INTRAMUSCULAR | Status: DC | PRN

## 2014-02-18 MED ORDER — PHENYLEPHRINE 8 MG IN D5W 100 ML (0.08MG/ML) PREMIX OPTIME
INJECTION | INTRAVENOUS | Status: DC | PRN
  Administered 2014-02-18: 60 ug/min via INTRAVENOUS

## 2014-02-18 MED ORDER — KETOROLAC TROMETHAMINE 30 MG/ML IJ SOLN: 30.0000 mg | Freq: Four times a day (QID) | INTRAMUSCULAR | Status: DC | PRN

## 2014-02-18 MED ORDER — PHENYLEPHRINE 8 MG IN D5W 100 ML (0.08MG/ML) PREMIX OPTIME
INJECTION | INTRAVENOUS | Status: AC
  Filled 2014-02-18: qty 100

## 2014-02-18 MED ORDER — DEXTROSE 5 % IV SOLN: 1.0000 ug/kg/h | INTRAVENOUS | Status: DC | PRN

## 2014-02-18 MED ORDER — KETOROLAC TROMETHAMINE 30 MG/ML IJ SOLN
30.0000 mg | Freq: Once | INTRAMUSCULAR | Status: AC
  Administered 2014-02-18: 30 mg via INTRAVENOUS
  Filled 2014-02-18: qty 1

## 2014-02-18 MED ORDER — DIPHENHYDRAMINE HCL 50 MG/ML IJ SOLN: 12.5000 mg | INTRAMUSCULAR | Status: DC | PRN

## 2014-02-18 MED ORDER — DIPHENHYDRAMINE HCL 50 MG/ML IJ SOLN: 25.0000 mg | INTRAMUSCULAR | Status: DC | PRN

## 2014-02-18 MED ORDER — BISACODYL 10 MG RE SUPP
10.0000 mg | Freq: Every day | RECTAL | Status: DC | PRN
Start: 2014-02-18 — End: 2014-02-20

## 2014-02-18 MED ORDER — CEFAZOLIN SODIUM-DEXTROSE 2-3 GM-% IV SOLR
2.0000 g | INTRAVENOUS | Status: AC
  Administered 2014-02-18: 2 g via INTRAVENOUS

## 2014-02-18 MED ORDER — LANOLIN HYDROUS EX OINT: 1.0000 "application " | TOPICAL_OINTMENT | CUTANEOUS | Status: DC | PRN

## 2014-02-18 MED ORDER — ONDANSETRON HCL 4 MG PO TABS: 4.0000 mg | ORAL_TABLET | ORAL | Status: DC | PRN

## 2014-02-18 MED ORDER — ONDANSETRON HCL 4 MG/2ML IJ SOLN
INTRAMUSCULAR | Status: DC | PRN
Start: 2014-02-18 — End: 2014-02-18
  Administered 2014-02-18: 4 mg via INTRAVENOUS

## 2014-02-18 MED ORDER — DIPHENHYDRAMINE HCL 25 MG PO CAPS
25.0000 mg | ORAL_CAPSULE | ORAL | Status: DC | PRN
  Filled 2014-02-18: qty 1

## 2014-02-18 MED ORDER — NALOXONE HCL 0.4 MG/ML IJ SOLN: 0.4000 mg | INTRAMUSCULAR | Status: DC | PRN

## 2014-02-18 MED ORDER — NALBUPHINE HCL 10 MG/ML IJ SOLN: 5.0000 mg | INTRAMUSCULAR | Status: DC | PRN

## 2014-02-18 MED ORDER — PRENATAL MULTIVITAMIN CH
1.0000 | ORAL_TABLET | Freq: Every day | ORAL | Status: DC
  Administered 2014-02-19 – 2014-02-20 (×2): 1 via ORAL
  Filled 2014-02-18 (×2): qty 1

## 2014-02-18 MED ORDER — IBUPROFEN 600 MG PO TABS
600.0000 mg | ORAL_TABLET | Freq: Four times a day (QID) | ORAL | Status: DC
  Administered 2014-02-19 – 2014-02-20 (×6): 600 mg via ORAL
  Filled 2014-02-18 (×6): qty 1

## 2014-02-18 MED ORDER — SIMETHICONE 80 MG PO CHEW
80.0000 mg | CHEWABLE_TABLET | ORAL | Status: DC
  Administered 2014-02-19 – 2014-02-20 (×2): 80 mg via ORAL
  Filled 2014-02-18: qty 1

## 2014-02-18 MED ORDER — OXYTOCIN 10 UNIT/ML IJ SOLN
40.0000 [IU] | INTRAVENOUS | Status: DC | PRN
  Administered 2014-02-18: 40 [IU] via INTRAVENOUS

## 2014-02-18 MED ORDER — FENTANYL CITRATE 0.05 MG/ML IJ SOLN
INTRAMUSCULAR | Status: DC | PRN
  Administered 2014-02-18: 25 ug via INTRATHECAL

## 2014-02-18 MED ORDER — MEPERIDINE HCL 25 MG/ML IJ SOLN: 6.2500 mg | INTRAMUSCULAR | Status: DC | PRN

## 2014-02-18 MED ORDER — LACTATED RINGERS IV SOLN: INTRAVENOUS | Status: DC

## 2014-02-18 MED ORDER — OXYTOCIN 40 UNITS IN LACTATED RINGERS INFUSION - SIMPLE MED: 62.5000 mL/h | INTRAVENOUS | Status: AC

## 2014-02-18 MED ORDER — DIPHENHYDRAMINE HCL 25 MG PO CAPS: 25.0000 mg | ORAL_CAPSULE | Freq: Four times a day (QID) | ORAL | Status: DC | PRN

## 2014-02-18 MED ORDER — METOCLOPRAMIDE HCL 5 MG/ML IJ SOLN: 10.0000 mg | Freq: Three times a day (TID) | INTRAMUSCULAR | Status: DC | PRN

## 2014-02-18 MED ORDER — ONDANSETRON HCL 4 MG/2ML IJ SOLN: 4.0000 mg | INTRAMUSCULAR | Status: DC | PRN

## 2014-02-18 MED ORDER — LACTATED RINGERS IV SOLN
INTRAVENOUS | Status: DC
  Administered 2014-02-18 (×2): via INTRAVENOUS

## 2014-02-18 MED ORDER — BUPIVACAINE IN DEXTROSE 0.75-8.25 % IT SOLN
INTRATHECAL | Status: DC | PRN
  Administered 2014-02-18: 1.1 mL via INTRATHECAL

## 2014-02-18 MED ORDER — CEFAZOLIN SODIUM-DEXTROSE 2-3 GM-% IV SOLR
INTRAVENOUS | Status: AC
Start: 2014-02-18 — End: 2014-02-19
  Filled 2014-02-18: qty 50

## 2014-02-18 MED ORDER — ONDANSETRON HCL 4 MG/2ML IJ SOLN
4.0000 mg | Freq: Three times a day (TID) | INTRAMUSCULAR | Status: DC | PRN
Start: 2014-02-18 — End: 2014-02-20

## 2014-02-18 MED ORDER — 0.9 % SODIUM CHLORIDE (POUR BTL) OPTIME
TOPICAL | Status: DC | PRN
  Administered 2014-02-18: 1000 mL

## 2014-02-18 MED ORDER — SIMETHICONE 80 MG PO CHEW
80.0000 mg | CHEWABLE_TABLET | Freq: Three times a day (TID) | ORAL | Status: DC
  Administered 2014-02-19 – 2014-02-20 (×5): 80 mg via ORAL
  Filled 2014-02-18 (×6): qty 1

## 2014-02-18 MED ORDER — SIMETHICONE 80 MG PO CHEW
80.0000 mg | CHEWABLE_TABLET | ORAL | Status: DC | PRN
Start: 2014-02-18 — End: 2014-02-20

## 2014-02-18 MED ORDER — MORPHINE SULFATE (PF) 0.5 MG/ML IJ SOLN
INTRAMUSCULAR | Status: DC | PRN
  Administered 2014-02-18: .15 mg via INTRATHECAL

## 2014-02-18 MED ORDER — MENTHOL 3 MG MT LOZG
1.0000 | LOZENGE | OROMUCOSAL | Status: DC | PRN
Start: 2014-02-18 — End: 2014-02-20

## 2014-02-18 MED ORDER — OXYCODONE-ACETAMINOPHEN 5-325 MG PO TABS
1.0000 | ORAL_TABLET | ORAL | Status: DC | PRN
  Administered 2014-02-19 – 2014-02-20 (×6): 1 via ORAL
  Filled 2014-02-18 (×5): qty 1

## 2014-02-18 MED ORDER — WITCH HAZEL-GLYCERIN EX PADS: 1.0000 "application " | MEDICATED_PAD | CUTANEOUS | Status: DC | PRN

## 2014-02-18 MED ORDER — LACTATED RINGERS IV SOLN: Freq: Once | INTRAVENOUS | Status: DC

## 2014-02-18 MED ORDER — FENTANYL CITRATE 0.05 MG/ML IJ SOLN
INTRAMUSCULAR | Status: AC
  Filled 2014-02-18: qty 2

## 2014-02-18 SURGICAL SUPPLY — 44 items
BENZOIN TINCTURE PRP APPL 2/3 (GAUZE/BANDAGES/DRESSINGS) ×3 IMPLANT
BINDER ABD UNIV 10 28-50 (GAUZE/BANDAGES/DRESSINGS) IMPLANT
BINDER ABD UNIV 12 45-62 (WOUND CARE) IMPLANT
BINDER ABDOM UNIV 10 (GAUZE/BANDAGES/DRESSINGS)
BINDER ABDOMINAL 46IN 62IN (WOUND CARE)
CLAMP CORD UMBIL (MISCELLANEOUS) ×3 IMPLANT
CLIP FILSHIE TUBAL LIGA STRL (Clip) ×3 IMPLANT
CLOSURE WOUND 1/2 X4 (GAUZE/BANDAGES/DRESSINGS) ×1
CLOTH BEACON ORANGE TIMEOUT ST (SAFETY) ×3 IMPLANT
DRAPE LG THREE QUARTER DISP (DRAPES) ×3 IMPLANT
DRSG OPSITE POSTOP 4X10 (GAUZE/BANDAGES/DRESSINGS) ×3 IMPLANT
DURAPREP 26ML APPLICATOR (WOUND CARE) ×3 IMPLANT
ELECT REM PT RETURN 9FT ADLT (ELECTROSURGICAL) ×3
ELECTRODE REM PT RTRN 9FT ADLT (ELECTROSURGICAL) ×1 IMPLANT
EXTRACTOR VACUUM KIWI (MISCELLANEOUS) IMPLANT
GAUZE SPONGE 4X4 12PLY STRL LF (GAUZE/BANDAGES/DRESSINGS) ×3 IMPLANT
GLOVE BIO SURGEON STRL SZ7 (GLOVE) ×3 IMPLANT
GLOVE BIOGEL PI IND STRL 7.0 (GLOVE) ×1 IMPLANT
GLOVE BIOGEL PI INDICATOR 7.0 (GLOVE) ×2
GOWN STRL NON-REIN LRG LVL3 (GOWN DISPOSABLE) ×12 IMPLANT
GOWN STRL REUS W/TWL LRG LVL3 (GOWN DISPOSABLE) ×6 IMPLANT
KIT ABG SYR 3ML LUER SLIP (SYRINGE) IMPLANT
NEEDLE HYPO 22GX1.5 SAFETY (NEEDLE) IMPLANT
NEEDLE HYPO 25X5/8 SAFETYGLIDE (NEEDLE) IMPLANT
NS IRRIG 1000ML POUR BTL (IV SOLUTION) ×3 IMPLANT
PACK C SECTION WH (CUSTOM PROCEDURE TRAY) ×3 IMPLANT
PAD ABD 7.5X8 STRL (GAUZE/BANDAGES/DRESSINGS) ×3 IMPLANT
PAD OB MATERNITY 4.3X12.25 (PERSONAL CARE ITEMS) ×3 IMPLANT
RTRCTR C-SECT PINK 25CM LRG (MISCELLANEOUS) ×3 IMPLANT
SPONGE SURGIFOAM ABS GEL 12-7 (HEMOSTASIS) IMPLANT
STAPLER VISISTAT 35W (STAPLE) IMPLANT
STRIP CLOSURE SKIN 1/2X4 (GAUZE/BANDAGES/DRESSINGS) ×2 IMPLANT
SUT PDS AB 0 CTX 60 (SUTURE) IMPLANT
SUT PLAIN 0 NONE (SUTURE) IMPLANT
SUT SILK 0 TIES 10X30 (SUTURE) IMPLANT
SUT VIC AB 0 CT1 36 (SUTURE) ×9 IMPLANT
SUT VIC AB 3-0 CT1 27 (SUTURE) ×2
SUT VIC AB 3-0 CT1 TAPERPNT 27 (SUTURE) ×1 IMPLANT
SUT VIC AB 4-0 KS 27 (SUTURE) ×3 IMPLANT
SYR CONTROL 10ML LL (SYRINGE) IMPLANT
TAPE CLOTH SURG 4X10 WHT LF (GAUZE/BANDAGES/DRESSINGS) ×3 IMPLANT
TOWEL OR 17X24 6PK STRL BLUE (TOWEL DISPOSABLE) ×9 IMPLANT
TRAY FOLEY CATH 14FR (SET/KITS/TRAYS/PACK) ×3 IMPLANT
WATER STERILE IRR 1000ML POUR (IV SOLUTION) IMPLANT

## 2014-02-18 NOTE — H&P (Signed)
Kara Hudson is a 38 y.o. female [phone] with IUP at 39.0 weeks presenting for repeat cesarean delivery. Pt states she has been having no contractions, no vaginal bleeding, intact membranes, with active fetal movements.    Prenatal Course Source of Care: [facility]  with onset of care at 9 weeks Pregnancy complications or risks: Patient Active Problem List   Diagnosis Date Noted  . Supervision of normal pregnancy 07/29/2013  . Advanced maternal age, antepartum 07/29/2013  . Previous cesarean section x 2, antepartum  07/29/2013  . Language barrier, speaks Spanish only 07/29/2013  . Desires Sterilization 07/29/2013   She desires to bilateral tubal ligation.  She plans to plans to breastfeed  Prenatal labs and studies: ABO, Rh: --/--/A POS, A POS (04/06 0908) Antibody: NEG (04/06 0908) Rubella:  immune RPR: NON REACTIVE (04/06 0909)  HBsAg: NEGATIVE (09/04 1414)  HIV: NON REACTIVE (01/19 1649)  GBS:   negative 1 hr Glucola 139 3 hr GTT: 76, 155, 99, 134 Genetic screeningdeclined Anatomy US normal  Past Medical History:  Past Medical History  Diagnosis Date  . Medical history non-contributory     Past Surgical History:  Past Surgical History  Procedure Laterality Date  . Cesarean section      Obstetrical History:  OB History   Grav Para Term Preterm Abortions TAB SAB Ect Mult Living   4 3 3  0 0 0 0 0 0 3      Social History:  History   Social History  . Marital Status: Legally Separated    Spouse Name: N/A    Number of Children: N/A  . Years of Education: N/A   Social History Main Topics  . Smoking status: Never Smoker   . Smokeless tobacco: Never Used  . Alcohol Use: No  . Drug Use: No  . Sexual Activity: Yes    Birth Control/ Protection: Condom   Other Topics Concern  . None   Social History Narrative  . None    Family History:  Family History  Problem Relation Age of Onset  . Hypertension Mother   . Hypertension Son     Medications:   Prenatal vitamins,  Current Facility-Administered Medications  Medication Dose Route Frequency Provider Last Rate Last Dose  . lactated ringers infusion   Intravenous Once Casimiro NeedleMichael A. Malen GauzeFoster, MD      . lactated ringers infusion   Intravenous Continuous Casimiro NeedleMichael A. Malen GauzeFoster, MD 125 mL/hr at 02/18/14 1403    . scopolamine (TRANSDERM-SCOP) 1 MG/3DAYS 1.5 mg  1 patch Transdermal Once Casimiro NeedleMichael A. Malen GauzeFoster, MD   1.5 mg at 02/18/14 1404    Allergies: No Known Allergies  Review of Systems: - negative  Physical Exam: Blood pressure 109/67, temperature 98.2 F (36.8 C), temperature source Oral, resp. rate 18, last menstrual period 05/21/2013, SpO2 99.00%. GENERAL: Well-developed, well-nourished female in no acute distress.  LUNGS: Clear to auscultation bilaterally.  HEART: Regular rate and rhythm. ABDOMEN: Soft, nontender, nondistended, gravid. EFW 8 lbs EXTREMITIES: Nontender, no edema, 2+ distal pulses. FHT:  Baseline rate 138 bpm      Pertinent Labs/Studies:    Assessment : Kara Hudson is a 38 y.o. [id number] at Unknown being admitted for cesarean delivery.  Plan: The risks of cesarean section discussed with the patient included but were not limited to: bleeding which may require transfusion or reoperation; infection which may require antibiotics; injury to bowel, bladder, ureters or other surrounding organs; injury to the fetus; need for additional procedures including hysterectomy in the event  of a life-threatening hemorrhage; placental abnormalities wth subsequent pregnancies, incisional problems, thromboembolic phenomenon and other postoperative/anesthesia complications. The patient concurred with the proposed plan, giving informed written consent for the procedure.  Anesthesia and OR aware.  Preoperative prophylactic Ancef ordered on call to the OR.  To OR when ready.   Pt to breastfeed.  [facility].   Levie Heritage 02/18/2014, 2:25 PM

## 2014-02-18 NOTE — Progress Notes (Signed)
38 y.o. G4P3003 at 39.0 weeks presenting for repeat cesarean delivery.  Pregnancy complicated by AMA, previous c-section x2, and elevated 1 hour glucose screen but passed 3 hr glucose screen.    The female infant was vigorous at delivery with APGARs 8 and 9, requiring no resuscitation other than standard warming and drying.  Normal exam with the exception of a small vaginal tag.  Admit to central nursery for routine newborn care.

## 2014-02-18 NOTE — Transfer of Care (Signed)
Immediate Anesthesia Transfer of Care Note  Patient: Kara Hudson  Procedure(s) Performed: Procedure(s) with comments: CESAREAN SECTION WITH BILATERAL TUBAL LIGATION (Bilateral) - filshie clips  Patient Location: PACU  Anesthesia Type:Spinal  Level of Consciousness: awake, alert  and oriented  Airway & Oxygen Therapy: Patient Spontanous Breathing  Post-op Assessment: Report given to PACU RN and Post -op Vital signs reviewed and stable  Post vital signs: Reviewed and stable  Complications: No apparent anesthesia complications

## 2014-02-18 NOTE — Anesthesia Postprocedure Evaluation (Signed)
  Anesthesia Post-op Note  Patient: Kara Hudson  Procedure(s) Performed: Procedure(s) with comments: CESAREAN SECTION WITH BILATERAL TUBAL LIGATION (Bilateral) - filshie clips  Patient Location: PACU  Anesthesia Type:Spinal  Level of Consciousness: awake, alert  and oriented  Airway and Oxygen Therapy: Patient Spontanous Breathing  Post-op Pain: none  Post-op Assessment: Post-op Vital signs reviewed, Patient's Cardiovascular Status Stable, Respiratory Function Stable, Patent Airway, No signs of Nausea or vomiting, Pain level controlled, No headache and No backache  Post-op Vital Signs: Reviewed and stable  Last Vitals:  Filed Vitals:   02/18/14 1800  BP: 93/55  Pulse: 78  Temp: 36.8 C  Resp: 18    Complications: No apparent anesthesia complications

## 2014-02-18 NOTE — Op Note (Signed)
Kara Hudson PROCEDURE DATE: 02/18/2014  PREOPERATIVE DIAGNOSES: Intrauterine pregnancy at  [redacted]w[redacted]d weeks gestation; previous uterine incision kronig; undesired fertility  POSTOPERATIVE DIAGNOSES: The same  PROCEDURE: Repeat Low Transverse Cesarean Section, Bilateral Tubal Sterilization using Filshie clips  SURGEON:  Dr. Adrian Blackwater ASSISTANT:  Dr. Hermina Staggers    INDICATIONS: Kara Hudson is a 38 y.o. [id number] at [redacted]w[redacted]d here for cesarean section and bilateral tubal sterilization secondary to the indications listed under preoperative diagnoses; please see preoperative note for further details.  The risks of surgery were discussed with the patient including but were not limited to: bleeding which may require transfusion or reoperation; infection which may require antibiotics; injury to bowel, bladder, ureters or other surrounding organs; injury to the fetus; need for additional procedures including hysterectomy in the event of a life-threatening hemorrhage; placental abnormalities wth subsequent pregnancies, incisional problems, thromboembolic phenomenon and other postoperative/anesthesia complications.  Patient also desires permanent sterilization.  Other reversible forms of contraception were discussed with patient; she declines all other modalities. Risks of procedure discussed with patient including but not limited to: risk of regret, permanence of method, bleeding, infection, injury to surrounding organs and need for additional procedures.  Failure risk of 0.5-1% with increased risk of ectopic gestation if pregnancy occurs was also discussed with patient.  The patient concurred with the proposed plan, giving informed written consent for the procedures.    FINDINGS:  Viable female infant in cephalic presentation.  Apgars 8 and 9.  Clear amniotic fluid.  Intact placenta, three vessel cord.  Normal uterus, fallopian tubes and ovaries bilaterally.  ANESTHESIA: Spinal INTRAVENOUS FLUIDS: 2000 ml ESTIMATED  BLOOD LOSS: 800 ml URINE OUTPUT:  200 ml SPECIMENS: Placenta sent to L&D COMPLICATIONS: None immediate  PROCEDURE IN DETAIL:  The patient preoperatively received intravenous antibiotics and had sequential compression devices applied to her lower extremities.   She was then taken to the operating room where spinal anesthesia was administered and was found to be adequate. She was then placed in a dorsal supine position with a leftward tilt, and prepped and draped in a sterile manner.  A foley catheter was placed into her bladder and attached to constant gravity.  After an adequate timeout was performed, a Pfannenstiel skin incision was made with scalpel and carried through to the underlying layer of fascia. The fascia was incised in the midline, and this incision was extended bilaterally using the Mayo scissors.  Kocher clamps were applied to the superior aspect of the fascial incision and the underlying rectus muscles were dissected off with mayos. A similar process was carried out on the inferior aspect of the fascial incision. The rectus muscles were separated in the midline bluntly and the peritoneum was entered bluntly. Attention was turned to the lower uterine segment where a low transverse hysterotomy was made with a scalpel and extended bilaterally bluntly.  The infant was successfully delivered, the cord was clamped and cut and the infant was handed over to awaiting neonatology team. Uterine massage was then administered, and the placenta delivered intact with a three-vessel cord. The uterus was then cleared of clot and debris.  The hysterotomy was closed with 0 Vicryl in a running locked fashion, and an imbricating layer was also placed with 0 Vicryl.  Attention was then turned to the fallopian tubes.  A Filshie clip was placed on both tubes, about 3 cm from the cornua, with care given to incorporate the underlying mesosalpinx on both sides, allowing for bilateral tubal sterilization. The pelvis was  cleared of  all clot and debris. Hemostasis was confirmed on all surfaces.  The peritoneum and the muscles were reapproximated using 0 Vicryl running stitch. The fascia was then closed using 0 Vicryl in a running fashion. The skin was closed with a 4-0 Vicryl subcuticular stitch. The patient tolerated the procedure well. Sponge, lap, instrument and needle counts were correct x 2.  She was taken to the recovery room in stable condition.   Tawana ScaleMichael Ryan Wakeelah Solan, MD OB Fellow

## 2014-02-18 NOTE — Anesthesia Preprocedure Evaluation (Addendum)
Anesthesia Evaluation  Patient identified by MRN, date of birth, ID band Patient awake    Reviewed: Allergy & Precautions, H&P , NPO status , Patient's Chart, lab work & pertinent test results  Airway Mallampati: III TM Distance: >3 FB Neck ROM: Full    Dental no notable dental hx. (+) Teeth Intact   Pulmonary neg pulmonary ROS,  breath sounds clear to auscultation  Pulmonary exam normal       Cardiovascular negative cardio ROS  Rhythm:Regular Rate:Normal     Neuro/Psych negative neurological ROS  negative psych ROS   GI/Hepatic negative GI ROS, Neg liver ROS,   Endo/Other  negative endocrine ROS  Renal/GU negative Renal ROS  negative genitourinary   Musculoskeletal negative musculoskeletal ROS (+)   Abdominal   Peds  Hematology negative hematology ROS (+)   Anesthesia Other Findings   Reproductive/Obstetrics (+) Pregnancy Previous C/section x 2                            Anesthesia Physical Anesthesia Plan  ASA: II  Anesthesia Plan: Spinal   Post-op Pain Management:    Induction:   Airway Management Planned: Natural Airway  Additional Equipment:   Intra-op Plan:   Post-operative Plan:   Informed Consent: I have reviewed the patients History and Physical, chart, labs and discussed the procedure including the risks, benefits and alternatives for the proposed anesthesia with the patient or authorized representative who has indicated his/her understanding and acceptance.     Plan Discussed with: Anesthesiologist, Surgeon and CRNA  Anesthesia Plan Comments:         Anesthesia Quick Evaluation

## 2014-02-18 NOTE — Anesthesia Procedure Notes (Signed)
Spinal  Patient location during procedure: OR Start time: 02/18/2014 3:20 PM Staffing Anesthesiologist: Suraj Ramdass A. Performed by: anesthesiologist  Preanesthetic Checklist Completed: patient identified, site marked, surgical consent, pre-op evaluation, timeout performed, IV checked, risks and benefits discussed and monitors and equipment checked Spinal Block Patient position: sitting Prep: site prepped and draped and DuraPrep Patient monitoring: heart rate, cardiac monitor, continuous pulse ox and blood pressure Approach: midline Location: L3-4 Injection technique: single-shot Needle Needle type: Sprotte  Needle gauge: 24 G Needle length: 9 cm Needle insertion depth: 4 cm Assessment Sensory level: T4 Additional Notes Patient tolerated procedure well. Adequate sensory level.

## 2014-02-19 ENCOUNTER — Encounter (HOSPITAL_COMMUNITY): Payer: Self-pay | Admitting: Family Medicine

## 2014-02-19 LAB — CBC
HEMATOCRIT: 29.8 % — AB (ref 36.0–46.0)
Hemoglobin: 10.3 g/dL — ABNORMAL LOW (ref 12.0–15.0)
MCH: 33 pg (ref 26.0–34.0)
MCHC: 34.6 g/dL (ref 30.0–36.0)
MCV: 95.5 fL (ref 78.0–100.0)
PLATELETS: 113 10*3/uL — AB (ref 150–400)
RBC: 3.12 MIL/uL — ABNORMAL LOW (ref 3.87–5.11)
RDW: 14.2 % (ref 11.5–15.5)
WBC: 6.5 10*3/uL (ref 4.0–10.5)

## 2014-02-19 NOTE — Progress Notes (Signed)
UR completed 

## 2014-02-19 NOTE — Progress Notes (Signed)
Subjective: Postpartum Day 1: Cesarean Delivery and BTL Patient reports incisional pain, tolerating PO, + flatus and no problems voiding.    Objective: Vital signs in last 24 hours: Temp:  [98.2 F (36.8 C)-99.2 F (37.3 C)] 99.1 F (37.3 C) (04/09 0350) Pulse Rate:  [64-86] 74 (04/09 0350) Resp:  [12-23] 16 (04/09 0350) BP: (88-110)/(44-78) 93/53 mmHg (04/09 0350) SpO2:  [97 %-100 %] 98 % (04/09 0350) Weight:  [60.328 kg (133 lb)] 60.328 kg (133 lb) (04/08 1500)  Physical Exam:  General: alert, cooperative, appears stated age and no distress Lochia: appropriate Uterine Fundus: firm Incision: C/D/I, pressure dressing in place DVT Evaluation: No evidence of DVT seen on physical exam. Negative Homan's sign. No cords or calf tenderness. No significant calf/ankle edema.   Recent Labs  02/16/14 0909 02/19/14 0602  HGB 12.7 10.3*  HCT 35.8* 29.8*    Assessment/Plan: Status post Cesarean section. Doing well postoperatively.  Continue current care. Plan to discharge tomorrow  Minta BalsamMichael R Dempsey Ahonen 02/19/2014, 8:50 AM

## 2014-02-19 NOTE — Lactation Note (Signed)
This note was copied from the chart of Kara Hudson. Lactation Consultation Note  Attempted visit with mom and baby.  Mom has room full of visitors.  MBU RN is concerned about mom having an inverted nipple and not latching baby to that side.  Encouraged mom to breast feed  baby on both sides and to call for assist with next feeding. Mom declines help at this time.   Patient Name: Kara Murvin DonningLidia Hudson JYNWG'NToday's Date: 02/19/2014     Maternal Data    Feeding Feeding Type: Breast Fed Length of feed: 15 min  LATCH Score/Interventions                      Lactation Tools Discussed/Used     Consult Status      Arvella MerlesJana Lynn Rachal Dvorsky 02/19/2014, 10:44 PM

## 2014-02-19 NOTE — Op Note (Signed)
Attestation of Attending Supervision of Obstetric Fellow During Surgery: Surgery was performed by the Obstetric Fellow under my supervision and collaboration.  I have reviewed the Obstetric Fellow's operative report, and I agree with the documentation.  I was present and scrubbed for the entire procedure.    Silvester Reierson, DO Attending Physician Faculty Practice, [facility]  

## 2014-02-19 NOTE — Lactation Note (Signed)
This note was copied from the chart of Kara Hudson. Lactation Consultation Note States sleepy, will latch on Rt. Nipple but hasn't tried the Lt.yet. Lg. Rt. Nipple. Lt. Nipple inverted and Lg. When pumped out w/DEBP. States BF all her babies 1 yr. No difficulty BF on Both nipples. States Lt. Nipple will come out. Spanish interpreter at bedside. WH/LC brochure given w/resources, support groups and LC services.Encouraged to call for assistance if needed and to verify proper latch. DEBP  started per Pt. Request. Patient Name: Kara Murvin DonningLidia Hudson ZOXWR'UToday's Date: 02/19/2014     Maternal Data    Feeding    LATCH Score/Interventions                      Lactation Tools Discussed/Used     Consult Status      Charyl DancerLaura G Dicky Boer 02/19/2014, 3:16 AM

## 2014-02-19 NOTE — Anesthesia Postprocedure Evaluation (Signed)
  Anesthesia Post-op Note  Patient: Kara Hudson  Procedure(s) Performed: Procedure(s) with comments: CESAREAN SECTION WITH BILATERAL TUBAL LIGATION (Bilateral) - filshie clips  Patient Location: Mother/Baby  Anesthesia Type:Spinal  Level of Consciousness: awake  Airway and Oxygen Therapy: Patient Spontanous Breathing  Post-op Pain: mild  Post-op Assessment: Patient's Cardiovascular Status Stable and Respiratory Function Stable  Post-op Vital Signs: stable  Last Vitals:  Filed Vitals:   02/19/14 0350  BP: 93/53  Pulse: 74  Temp: 37.3 C  Resp: 16    Complications: No apparent anesthesia complications

## 2014-02-19 NOTE — Addendum Note (Signed)
Addendum created 02/19/14 0757 by Renford DillsJanet L Carley Strickling, CRNA   Modules edited: Notes Section   Notes Section:  File: [id number]

## 2014-02-20 LAB — BIRTH TISSUE RECOVERY COLLECTION (PLACENTA DONATION)

## 2014-02-20 MED ORDER — IBUPROFEN 600 MG PO TABS: 600.0000 mg | ORAL_TABLET | Freq: Four times a day (QID) | ORAL | Status: DC | PRN

## 2014-02-20 MED ORDER — OXYCODONE-ACETAMINOPHEN 5-325 MG PO TABS: 1.0000 | ORAL_TABLET | ORAL | Status: DC | PRN

## 2014-02-20 NOTE — Discharge Instructions (Signed)
Cuidados en el postparto luego de un parto por cesárea  °(Postpartum Care After Cesarean Delivery) °Después del parto (período de postparto), la estadía normal en el hospital es de 24-72 horas. Si hubo problemas con el trabajo de parto o el parto, o si tiene otros problemas médicos, es posible que deba permanecer en el hospital por más tiempo.  °Mientras esté en el hospital, recibirá ayuda e instrucciones sobre cómo cuidar de usted misma y de su bebé recién nacido durante el postparto.  °Mientras esté en el hospital:  °· Es normal que sienta dolor o molestias en la incisión en el abdomen. Asegúrese de decirle a las enfermeras si siente dolor, así como donde siente el dolor y qué empeora el dolor. °· Si está amamantando, puede sentir contracciones dolorosas en el útero durante algunas semanas. Esto es normal. Las contracciones ayudan a que el útero vuelva a su tamaño normal. °· Es normal tener algo de sangrado después del parto. °· Durante los primeros 1-3 días después del parto, el flujo es de color rojo y la cantidad puede ser similar a un período. °· Es frecuente que el flujo se inicie y se detenga. °· En los primeros días, puede eliminar algunos coágulos pequeños. Informe a las enfermeras si comienza a eliminar coágulos grandes o aumenta el flujo. °· No  elimine los coágulos de sangre por el inodoro antes de que la enfermera los vea. °· Durante los próximos 3 a 10 días después del parto, el flujo debe ser más acuoso y rosado o marrón. °· De diez a catorce días después del parto, el flujo debe ser una pequeña cantidad de secreción de color blanco amarillento. °· La cantidad de flujo disminuirá en las primeras semanas después del parto. El flujo puede detenerse en 6-8 semanas. La mayoría de las mujeres no tienen más flujo a las 12 semanas después del parto. °· Usted debe cambiar sus apósitos con frecuencia. °· Lávese bien las manos con agua y jabón durante al menos 20 segundos después de cambiar el apósito, usar el  baño o antes de sostener o alimentar a su recién nacido. °· Se le quitará la vía intravenosa (IV) cuando ya esté bebiendo suficientes líquidos. °· El tubo de drenaje para la orina (catéter urinario) que se inserta antes del parto puede ser retirado luego de 6-8 horas después del parto o cuando las piernas vuelvan a tener sensibilidad. Usted puede sentir que tiene que vaciar la vejiga durante las primeras 6-8 horas después de que le quiten el catéter. °· Si se siente débil, mareada o se desmaya, llame a su enfermera antes de levantarse de la cama por primera vez y antes de tomar una ducha por primera vez. °· En los primeros días después del parto, podrá sentir las mamas sensibles y llenas. Esto se llama congestión. La sensibilidad en los senos por lo general desaparece dentro de las 48-72 horas después de que ocurre la congestión. También puede notar que la leche se escapa de sus senos. Si no está amamantando no estimule sus pechos. La estimulación de las mamas hace que sus senos produzcan más leche. °· Pasar tanto tiempo como sea posible con el bebé recién nacido es muy importante. Durante este tiempo, usted y su bebé deben sentirse cerca y conocerse uno al otro. Tener al bebé en su habitación (alojamiento conjunto) ayudará a fortalecer el vínculo con el bebé recién nacido. Esto le dará tiempo para conocerlo y atenderlo de manera cómoda. °· Las hormonas se modifican después del parto. A   veces, los cambios hormonales pueden causar tristeza o ganas de llorar por un tiempo. Estos sentimientos no deben durar más de unos pocos días. Si duran más que eso, debe hablar con su médico. °· Si lo desea, hable con su médico acerca de los métodos de planificación familiar o métodos anticonceptivos. °· Hable con su médico acerca de las vacunas. El médico puede indicarle que se aplique las siguientes vacunas antes de salir del hospital: °· Vacuna contra el tétanos, la difteria y la tos ferina (Tdap) o el tétanos y la difteria (Td).  Es muy importante que usted y su familia (incluyendo a los abuelos) u otras personas que cuidan al recién nacido estén al día con las vacunas Tdap o Td. Las vacunas Tdap o Td pueden ayudar a proteger al recién nacido de enfermedades. °· Inmunización contra la rubéola. °· Inmunización contra la varicela. °· Inmunización contra la gripe. Usted debe recibir esta vacunación anual si no la ha recibido durante el embarazo. °Document Released: 10/16/2012 °[employer]® Patient Information ©2014 [employer]. ° °

## 2014-02-20 NOTE — Discharge Summary (Signed)
Obstetric Discharge Summary Reason for Admission: cesarean section Prenatal Procedures: none Intrapartum Procedures: cesarean: low cervical, transverse Postpartum Procedures: P.P. tubal ligation Complications-Operative and Postpartum: none  Hospital Course: Mrs. Kara Hudson is a 38 y.o., [id number], [id number] presenting for repeat LTCS with bilateral tubal ligation completed 02/18/2014 at 16:22. Patient is doing well, up ad lib, tolerating PO, voiding, passing gas. Incision has scant amount of bleeding but is otherwise clean and intact. Patient is very tender around incision. She is breastfeeding supplementing with bottle.   Plan is to d/c home today.   Delivery Note At 3:44 PM a viable female was delivered via C-Section, Low Transverse (Presentation: ;  ).  APGAR: 8, 9; weight 7 lb 12 oz (3515 g).   Placenta status: Intact, Manual removal.  Cord: 3 vessels with the following complications: .    Anesthesia: Spinal  Episiotomy:  Lacerations:  Suture Repair: vicryl Est. Blood Loss (mL):   Mom to postpartum.  Baby to Couplet care / Skin to Skin.  Wallis BambergMario Mani 02/20/2014, 7:51 AM     H/H: Lab Results  Component Value Date/Time   HGB 10.3* 02/19/2014  6:02 AM   HCT 29.8* 02/19/2014  6:02 AM    Filed Vitals:   02/20/14 0625  BP: 98/62  Pulse: 67  Temp: 98.3 F (36.8 C)  Resp: 18    Physical Exam: VSS NAD Abd: Appropriately tender, ND, Fundus @U -firm Incision: scant amount of blood, clean and intact No c/c/e, Neg homan's sign, neg cords Lochia Appropriate  Discharge Diagnoses: repeat low transverse Cesaren section with bilateral tubal ligation  Discharge Information: Date: 05/25/2011 Activity: pelvic rest Diet: routine  Medications: Percocet Breast feeding:  Yes supplementing with bottle Condition: stable Instructions: refer to handout Discharge to: home      Medication List         ibuprofen 600 MG tablet  Commonly known as:  ADVIL,MOTRIN  Take 1 tablet (600 mg  total) by mouth every 6 (six) hours as needed.     oxyCODONE-acetaminophen 5-325 MG per tablet  Commonly known as:  PERCOCET/ROXICET  Take 1 tablet by mouth every 4 (four) hours as needed.     prenatal multivitamin Tabs tablet  Take 1 tablet by mouth daily at 12 noon.       Follow-up Information   [facility]. Schedule an appointment as soon as possible for a visit in 6 weeks.   Specialty:  Obstetrics and Gynecology   Contact information:   [address] [place] [address] [phone]       Wallis BambergMario Mani 02/20/2014,7:51 AM    Evaluation and management procedures were performed by PA-S under my supervision/collaboration. Chart reviewed, patient examined by me and I agree with management and plan. Danae Orleanseirdre C Heavenlee Maiorana, CNM 02/20/2014 10:13 AM

## 2014-02-20 NOTE — Discharge Summary (Signed)
Attestation of Attending Supervision of Advanced Practitioner (PA/CNM/NP): Evaluation and management procedures were performed by the Advanced Practitioner under my supervision and collaboration.  I have reviewed the Advanced Practitioner's note and chart, and I agree with the management and plan.  Asjia Berrios, DO Attending Physician Faculty Practice, [facility]  

## 2014-04-07 ENCOUNTER — Ambulatory Visit (INDEPENDENT_AMBULATORY_CARE_PROVIDER_SITE_OTHER): Payer: Self-pay | Admitting: Family Medicine

## 2014-04-07 ENCOUNTER — Encounter: Payer: Self-pay | Admitting: Family Medicine

## 2014-04-07 NOTE — Patient Instructions (Signed)
Cuidados en el postparto luego de un parto por cesárea  °(Postpartum Care After Cesarean Delivery) °Después del parto (período de postparto), la estadía normal en el hospital es de 24-72 horas. Si hubo problemas con el trabajo de parto o el parto, o si tiene otros problemas médicos, es posible que deba permanecer en el hospital por más tiempo.  °Mientras esté en el hospital, recibirá ayuda e instrucciones sobre cómo cuidar de usted misma y de su bebé recién nacido durante el postparto.  °Mientras esté en el hospital:  °· Es normal que sienta dolor o molestias en la incisión en el abdomen. Asegúrese de decirle a las enfermeras si siente dolor, así como donde siente el dolor y qué empeora el dolor. °· Si está amamantando, puede sentir contracciones dolorosas en el útero durante algunas semanas. Esto es normal. Las contracciones ayudan a que el útero vuelva a su tamaño normal. °· Es normal tener algo de sangrado después del parto. °· Durante los primeros 1-3 días después del parto, el flujo es de color rojo y la cantidad puede ser similar a un período. °· Es frecuente que el flujo se inicie y se detenga. °· En los primeros días, puede eliminar algunos coágulos pequeños. Informe a las enfermeras si comienza a eliminar coágulos grandes o aumenta el flujo. °· No  elimine los coágulos de sangre por el inodoro antes de que la enfermera los vea. °· Durante los próximos 3 a 10 días después del parto, el flujo debe ser más acuoso y rosado o marrón. °· De diez a catorce días después del parto, el flujo debe ser una pequeña cantidad de secreción de color blanco amarillento. °· La cantidad de flujo disminuirá en las primeras semanas después del parto. El flujo puede detenerse en 6-8 semanas. La mayoría de las mujeres no tienen más flujo a las 12 semanas después del parto. °· Usted debe cambiar sus apósitos con frecuencia. °· Lávese bien las manos con agua y jabón durante al menos 20 segundos después de cambiar el apósito, usar el  baño o antes de sostener o alimentar a su recién nacido. °· Se le quitará la vía intravenosa (IV) cuando ya esté bebiendo suficientes líquidos. °· El tubo de drenaje para la orina (catéter urinario) que se inserta antes del parto puede ser retirado luego de 6-8 horas después del parto o cuando las piernas vuelvan a tener sensibilidad. Usted puede sentir que tiene que vaciar la vejiga durante las primeras 6-8 horas después de que le quiten el catéter. °· Si se siente débil, mareada o se desmaya, llame a su enfermera antes de levantarse de la cama por primera vez y antes de tomar una ducha por primera vez. °· En los primeros días después del parto, podrá sentir las mamas sensibles y llenas. Esto se llama congestión. La sensibilidad en los senos por lo general desaparece dentro de las 48-72 horas después de que ocurre la congestión. También puede notar que la leche se escapa de sus senos. Si no está amamantando no estimule sus pechos. La estimulación de las mamas hace que sus senos produzcan más leche. °· Pasar tanto tiempo como sea posible con el bebé recién nacido es muy importante. Durante este tiempo, usted y su bebé deben sentirse cerca y conocerse uno al otro. Tener al bebé en su habitación (alojamiento conjunto) ayudará a fortalecer el vínculo con el bebé recién nacido. Esto le dará tiempo para conocerlo y atenderlo de manera cómoda. °· Las hormonas se modifican después del parto. A   veces, los cambios hormonales pueden causar tristeza o ganas de llorar por un tiempo. Estos sentimientos no deben durar más de unos pocos días. Si duran más que eso, debe hablar con su médico. °· Si lo desea, hable con su médico acerca de los métodos de planificación familiar o métodos anticonceptivos. °· Hable con su médico acerca de las vacunas. El médico puede indicarle que se aplique las siguientes vacunas antes de salir del hospital: °· Vacuna contra el tétanos, la difteria y la tos ferina (Tdap) o el tétanos y la difteria (Td).  Es muy importante que usted y su familia (incluyendo a los abuelos) u otras personas que cuidan al recién nacido estén al día con las vacunas Tdap o Td. Las vacunas Tdap o Td pueden ayudar a proteger al recién nacido de enfermedades. °· Inmunización contra la rubéola. °· Inmunización contra la varicela. °· Inmunización contra la gripe. Usted debe recibir esta vacunación anual si no la ha recibido durante el embarazo. °Document Released: 10/16/2012 °ExitCare® Patient Information ©2014 [employer]. ° °

## 2014-04-07 NOTE — Progress Notes (Signed)
  Subjective:     Kara Hudson is a 38 y.o. female who presents for a postpartum visit. She is 6 weeks postpartum following a low cervical transverse Cesarean section. I have fully reviewed the prenatal and intrapartum course. The delivery was at 39 gestational weeks. Outcome: repeat cesarean section, low transverse incision. Anesthesia: spinal. Postpartum course has been uncomplicated. Baby's course has been well. Baby is feeding by breast. Bleeding staining only. Bowel function is normal. Bladder function is normal. Patient is not sexually active. Contraception method is none. Postpartum depression screening: negative.  The following portions of the patient's history were reviewed and updated as appropriate: allergies, current medications, past family history, past medical history, past social history, past surgical history and problem list.  Review of Systems Pertinent items are noted in HPI.   Objective:    BP 111/66  Pulse 63  Ht 4\' 6"  (1.372 m)  Wt 115 lb 12.8 oz (52.527 kg)  BMI 27.90 kg/m2  Breastfeeding? Yes  General:  alert, cooperative and appears stated age  Abdomen: soft, some minimal tenderness left mid-lower quadrant--likely related to surgery.  Incision is well healed    Vulva:  normal  Vagina: normal vagina  Cervix:  no cervical motion tenderness and no lesions  Corpus: normal size, contour, position, consistency, mobility, non-tender  Adnexa:  normal adnexa        Assessment:     Normal postpartum exam. Pap smear not done at today's visit.   Plan:    1. Contraception: tubal ligation 2. Doing well 3. Follow up in: 1 year or as needed.

## 2014-09-14 ENCOUNTER — Encounter: Payer: Self-pay | Admitting: Family Medicine

## 2018-04-24 ENCOUNTER — Ambulatory Visit (INDEPENDENT_AMBULATORY_CARE_PROVIDER_SITE_OTHER): Payer: Self-pay

## 2018-04-24 ENCOUNTER — Encounter (HOSPITAL_COMMUNITY): Payer: Self-pay | Admitting: Emergency Medicine

## 2018-04-24 ENCOUNTER — Ambulatory Visit (HOSPITAL_COMMUNITY)
Admission: EM | Admit: 2018-04-24 | Discharge: 2018-04-24 | Disposition: A | Discharge status: Home / Self Care | Payer: Self-pay | Attending: Family Medicine | Admitting: Family Medicine

## 2018-04-24 DIAGNOSIS — J189 Pneumonia, unspecified organism: Secondary | ICD-10-CM

## 2018-04-24 DIAGNOSIS — J181 Lobar pneumonia, unspecified organism: Secondary | ICD-10-CM

## 2018-04-24 MED ORDER — AZITHROMYCIN 250 MG PO TABS: 250.0000 mg | ORAL_TABLET | Freq: Every day | ORAL | 0 refills | Status: DC

## 2018-04-24 NOTE — Discharge Instructions (Addendum)
Chest x-ray positive for pneumonia.  Start azithromycin as directed. Keep hydrated, your urine should be clear to pale yellow in color. Continue tylenol/motrin for pain or fever. Monitor for any worsening of symptoms, chest pain, shortness of breath, wheezing, worsening fever, follow up for reevaluation needed.   Follow up here or with PCP in 4-6 weeks for repeat chest xray.

## 2018-04-24 NOTE — ED Provider Notes (Signed)
[facility]    CSN: [id number] Arrival date & time: 04/24/18  1612     History   Chief Complaint Chief Complaint  Patient presents with  . Cough    HPI Kara Hudson is a 42 y.o. female.   42 year old female comes in for 1 week history of URI symptoms.  States had a week of sore throat, 4 days ago started having productive cough, and today with blood-tinged sputum.  Denies rhinorrhea, nasal congestion.  States had a T-max of 101 last week, but has since resolved.  Denies chills, night sweats.  Has not taken anything for the symptoms.  Never smoker.     Past Medical History:  Diagnosis Date  . Medical history non-contributory     Patient Active Problem List   Diagnosis Date Noted  . Language barrier, speaks Spanish only 07/29/2013    Past Surgical History:  Procedure Laterality Date  . CESAREAN SECTION    . CESAREAN SECTION WITH BILATERAL TUBAL LIGATION Bilateral 02/18/2014   Procedure: CESAREAN SECTION WITH BILATERAL TUBAL LIGATION;  Surgeon: Levie Heritage, DO;  Location: [facility];  Service: Obstetrics;  Laterality: Bilateral;  filshie clips    OB History    Gravida  4   Para  4   Term  4   Preterm  0   AB  0   Living  4     SAB  0   TAB  0   Ectopic  0   Multiple  0   Live Births  4            Home Medications    Prior to Admission medications   Medication Sig Start Date End Date Taking? Authorizing Provider  azithromycin (ZITHROMAX) 250 MG tablet Take 1 tablet (250 mg total) by mouth daily. Take first 2 tablets together, then 1 every day until finished. 04/24/18   Belinda Fisher, PA-C  Prenatal Vit-Fe Fumarate-FA (PRENATAL MULTIVITAMIN) TABS tablet Take 1 tablet by mouth daily at 12 noon.    [provider]    Family History Family History  Problem Relation Age of Onset  . Hypertension Mother   . Hypertension Son     Social History Social History   Tobacco Use  . Smoking status: Never Smoker  . Smokeless  tobacco: Never Used  Substance Use Topics  . Alcohol use: No  . Drug use: No     Allergies   Patient has no known allergies.   Review of Systems Review of Systems  Reason unable to perform ROS: See HPI as above.     Physical Exam Triage Vital Signs ED Triage Vitals [04/24/18 1638]  Enc Vitals Group     BP 112/75     Pulse Rate 75     Resp 18     Temp 98.8 F (37.1 C)     Temp Source Oral     SpO2 100 %     Weight      Height      Head Circumference      Peak Flow      Pain Score      Pain Loc      Pain Edu?      Excl. in GC?    No data found.  Updated Vital Signs BP 112/75 (BP Location: Left Arm)   Pulse 75   Temp 98.8 F (37.1 C) (Oral)   Resp 18   LMP 04/03/2018 (Approximate)   SpO2 100%  Physical Exam  Constitutional: She is oriented to person, place, and time. She appears well-developed and well-nourished. No distress.  HENT:  Head: Normocephalic and atraumatic.  Right Ear: Tympanic membrane, external ear and ear canal normal. Tympanic membrane is not erythematous and not bulging.  Left Ear: Tympanic membrane, external ear and ear canal normal. Tympanic membrane is not erythematous and not bulging.  Nose: Rhinorrhea present. Right sinus exhibits no maxillary sinus tenderness and no frontal sinus tenderness. Left sinus exhibits no maxillary sinus tenderness and no frontal sinus tenderness.  Mouth/Throat: Uvula is midline, oropharynx is clear and moist and mucous membranes are normal.  Eyes: Pupils are equal, round, and reactive to light. Conjunctivae are normal.  Neck: Normal range of motion. Neck supple.  Cardiovascular: Normal rate, regular rhythm and normal heart sounds. Exam reveals no gallop and no friction rub.  No murmur heard. Pulmonary/Chest: Effort normal and breath sounds normal. No accessory muscle usage or stridor. No respiratory distress. She has no decreased breath sounds. She has no wheezes. She has no rhonchi. She has no rales.    Lymphadenopathy:    She has no cervical adenopathy.  Neurological: She is alert and oriented to person, place, and time.  Skin: Skin is warm and dry.  Psychiatric: She has a normal mood and affect. Her behavior is normal. Judgment normal.     UC Treatments / Results  Labs (all labs ordered are listed, but only abnormal results are displayed) Labs Reviewed - No data to display  EKG None  Radiology Dg Chest 2 View  Result Date: 04/24/2018 CLINICAL DATA:  Productive cough, hemoptysis EXAM: CHEST - 2 VIEW COMPARISON:  None. FINDINGS: Normal heart size. Normal mediastinal contour. No pneumothorax. No pleural effusion. Mild patchy opacity in the right middle lobe partially silhouetting the right heart border. No pulmonary edema. IMPRESSION: Mild patchy right middle lobe opacity, which may indicate a right middle lobe pneumonia. Recommend follow-up PA and lateral post treatment chest radiographs in 4-6 weeks. These results will be called to the ordering clinician or representative by the Radiology Department at the imaging location. Electronically Signed   By: Delbert PhenixJason A Poff M.D.   On: 04/24/2018 17:26    Procedures Procedures (including critical care time)  Medications Ordered in UC Medications - No data to display  Initial Impression / Assessment and Plan / UC Course  I have reviewed the triage vital signs and the nursing notes.  Pertinent labs & imaging results that were available during my care of the patient were reviewed by me and considered in my medical decision making (see chart for details).    Discussed chest x-ray results with patient.  Start azithromycin as directed.  Other symptomatic treatment discussed.  Return precautions given.  Otherwise follow-up here with PCP in 4 to 6 weeks for repeat chest x-ray.  Patient expresses understanding and agrees to plan.  Final Clinical Impressions(s) / UC Diagnoses   Final diagnoses:  Pneumonia of right middle lobe due to infectious  organism [facility](HCC)    ED Prescriptions    Medication Sig Dispense Auth. Provider   azithromycin (ZITHROMAX) 250 MG tablet Take 1 tablet (250 mg total) by mouth daily. Take first 2 tablets together, then 1 every day until finished. 6 tablet Threasa AlphaYu, Sharayah Renfrow V, PA-C        Ameena Vesey V, [place]-C 04/24/18 1739

## 2018-04-24 NOTE — ED Triage Notes (Signed)
Pt here with cough and blood tinged sputum today

## 2018-05-27 ENCOUNTER — Ambulatory Visit (INDEPENDENT_AMBULATORY_CARE_PROVIDER_SITE_OTHER): Payer: Self-pay | Admitting: Physician Assistant

## 2018-05-27 ENCOUNTER — Encounter (INDEPENDENT_AMBULATORY_CARE_PROVIDER_SITE_OTHER): Payer: Self-pay | Admitting: Physician Assistant

## 2018-05-27 ENCOUNTER — Other Ambulatory Visit: Payer: Self-pay

## 2018-05-27 VITALS — BP 120/76 | HR 83 | Temp 98.7°F | Ht <= 58 in | Wt 116.0 lb

## 2018-05-27 DIAGNOSIS — Z9851 Tubal ligation status: Secondary | ICD-10-CM | POA: Insufficient documentation

## 2018-05-27 DIAGNOSIS — M546 Pain in thoracic spine: Secondary | ICD-10-CM

## 2018-05-27 DIAGNOSIS — J189 Pneumonia, unspecified organism: Secondary | ICD-10-CM | POA: Insufficient documentation

## 2018-05-27 DIAGNOSIS — J181 Lobar pneumonia, unspecified organism: Secondary | ICD-10-CM

## 2018-05-27 DIAGNOSIS — K219 Gastro-esophageal reflux disease without esophagitis: Secondary | ICD-10-CM | POA: Insufficient documentation

## 2018-05-27 DIAGNOSIS — G8929 Other chronic pain: Secondary | ICD-10-CM

## 2018-05-27 DIAGNOSIS — K2950 Unspecified chronic gastritis without bleeding: Secondary | ICD-10-CM

## 2018-05-27 MED ORDER — AMOXICILLIN-POT CLAVULANATE 875-125 MG PO TABS: 1.0000 | ORAL_TABLET | Freq: Two times a day (BID) | ORAL | 0 refills | Status: AC

## 2018-05-27 MED ORDER — OMEPRAZOLE 40 MG PO CPDR: 40.0000 mg | DELAYED_RELEASE_CAPSULE | Freq: Every day | ORAL | 3 refills | Status: DC

## 2018-05-27 NOTE — Patient Instructions (Signed)
Análisis de anticuerpos a Helicobacter Pylori  (Helicobacter Pylori Antibodies Test)  ¿POR QUÉ ME DEBO REALIZAR ESTA PRUEBA?  Este es un análisis de sangre que busca detectar la presencia de bacterias llamadas Helicobacter pylori (H. pylori). H. pylori es una bacteria que se encuentra en las células que recubren el estómago. Los altos niveles de H. pylori en el estómago lo ponen en riesgo de tener úlceras estomacales y del intestino delgado, inflamación prolongada (crónica) de la mucosa gástrica o, incluso, úlceras que pueden formarse en el tubo que va desde la boca hasta el estómago (esófago). Si no se trata, la presencia de H. pylori también puede incrementar el riesgo de sufrir cáncer de estómago.  La mayoría de las personas con H. pylori en el estómago no presentan síntomas. El médico puede indicarle que se haga este análisis si tiene síntomas de úlcera estomacal o del intestino delgado, por ejemplo, dolor de estómago antes o después de comer, acidez estomacal o náuseas repetidas veces después de comer.  ¿QUÉ TIPO DE MUESTRA SE TOMA?  Para este análisis, se extrae una muestra de sangre. Por lo general, para extraerla, se introduce una aguja en una vena o se pincha un dedo con una aguja pequeña.  ¿CÓMO DEBO PREPARARME PARA ESTA PRUEBA?  No se requiere preparación para este análisis.  ¿QUÉ SON LOS VALORES DE REFERENCIA?  Los valores de referencia son los valores saludables establecidos después de realizarle el análisis a un grupo grande de personas sanas. Pueden variar entre diferentes personas, laboratorios y hospitales. Es su responsabilidad retirar el resultado del estudio. Consulte en el laboratorio o en el departamento en el que fue realizado el estudio cuándo y cómo podrá obtener los resultados.  Los resultados del análisis se informarán como positivos, negativos o dudosos. El término dudoso significa que los resultados no son positivos ni negativos.   Los valores de referencia para estos resultados son los siguientes:  · Menor o igual que 30 unidades/ml. El resultado es negativo.  · Mayor o igual que 40 unidades/ml. El resultado es positivo.  · De 30,01 a 39,99 unidades/ml. El resultado es dudoso.  ¿QUÉ SIGNIFICAN LOS RESULTADOS?  Los resultados que superan los valores normales pueden indicar la presencia de diversas enfermedades. Estas pueden incluir lo siguiente:  · Irritación a corto o largo plazo de la membrana gástrica (gastritis).  · Úlcera del intestino delgado.  · Úlcera estomacal.  · Cáncer de estómago.  Hable con el médico sobre los resultados, las opciones de tratamiento y, si es necesario, la necesidad de realizar más estudios. Hable con el médico si tiene alguna pregunta sobre los resultados.  Esta información no tiene como fin reemplazar el consejo del médico. Asegúrese de hacerle al médico cualquier pregunta que tenga.  Document Released: 08/20/2013 Document Revised: 11/20/2014 Document Reviewed: 03/13/2014  Elsevier Interactive Patient Education © 2018 Elsevier Inc.

## 2018-05-27 NOTE — Progress Notes (Signed)
Subjective:  Patient ID: Kara Hudson, female    DOB: Mar 31, 1976  Age: 42 y.o. MRN: [id number]  CC: hospital f/u  HPI Kara Hudson is a 42 y.o. female with a medical history of gastritis and BTL presents as a new patient on urgent care f/u. Went to urgent care for URI sxs with 101 degree Fahrenheit fever on 04/24/18 and was diagnosed with PNA of right middle lobe. Prescribed Azithromycin. Says she is feeling generally better in regards to cough but left thoracic back pain remains the same. Back pain began in the last week of May. Endorses occasional acid reflux of approximately 1-2 times per month. Likes to eat spicy foods and thinks these foods aggravate her gastritis. Does not endorse injury to the back, CP, palpitations, SOB, HA, f/c/n/v, rash, swelling, or GI/GU sxs.         Outpatient Medications Prior to Visit  Medication Sig Dispense Refill  . azithromycin (ZITHROMAX) 250 MG tablet Take 1 tablet (250 mg total) by mouth daily. Take first 2 tablets together, then 1 every day until finished. 6 tablet 0  . Prenatal Vit-Fe Fumarate-FA (PRENATAL MULTIVITAMIN) TABS tablet Take 1 tablet by mouth daily at 12 noon.     No facility-administered medications prior to visit.      ROS Review of Systems  Constitutional: Negative for chills, fever and malaise/fatigue.  Eyes: Negative for blurred vision.  Respiratory: Negative for shortness of breath.   Cardiovascular: Negative for chest pain and palpitations.  Gastrointestinal: Negative for abdominal pain and nausea.  Genitourinary: Negative for dysuria and hematuria.  Musculoskeletal: Positive for back pain. Negative for joint pain and myalgias.  Skin: Negative for rash.  Neurological: Negative for tingling and headaches.  Psychiatric/Behavioral: Negative for depression. The patient is not nervous/anxious.     Objective:  There were no vitals taken for this visit.  BP/Weight 04/24/2018 04/07/2014 02/20/2014  Systolic BP 112 111 98   Diastolic BP 75 66 62  Wt. (Lbs) - 115.8 -  BMI - 27.9 -      Physical Exam  Constitutional: She is oriented to person, place, and time.  Well developed, well nourished, NAD, polite  HENT:  Head: Normocephalic and atraumatic.  Eyes: No scleral icterus.  Neck: Normal range of motion. Neck supple. No thyromegaly present.  Cardiovascular: Normal rate, regular rhythm and normal heart sounds. Exam reveals no gallop and no friction rub.  No murmur heard. Pulmonary/Chest: Effort normal.  High pitched rhonchi in the right middle and lower lobe  Abdominal: Soft. Bowel sounds are normal. She exhibits no distension and no mass. There is tenderness (Very mild TTP in the RUQ ). There is no rebound and no guarding. No hernia.  No hepatosplenomegaly  Musculoskeletal: She exhibits no edema.  Neurological: She is alert and oriented to person, place, and time.  Skin: Skin is warm and dry. No rash noted. No erythema. No pallor.  Psychiatric: She has a normal mood and affect. Her behavior is normal. Thought content normal.  Vitals reviewed.    Assessment & Plan:   1. Pneumonia of right middle lobe due to infectious organism [facility]) - DG Chest 2 View; Future - Finished azithromycin course. Right middle lobe with persistent abnormal lung sound.  - Begin Augmentin 875-125 mg  2. Chronic left-sided thoracic back pain - EKG 12-Lead - CBC with Differential - Comprehensive metabolic panel - Lipase - H. pylori antibody, IgG  3. Gastroesophageal reflux disease, esophagitis presence not specified - omeprazole (PRILOSEC) 40 MG capsule;  Take 1 capsule (40 mg total) by mouth daily.  Dispense: 30 capsule; Refill: 3  4. Chronic gastritis without bleeding, unspecified gastritis type - CBC with Differential - Comprehensive metabolic panel - H. pylori antibody, IgG - Begin omeprazole (PRILOSEC) 40 MG capsule; Take 1 capsule (40 mg total) by mouth daily.  Dispense: 30 capsule; Refill: 3     Meds  ordered this encounter  Medications  . omeprazole (PRILOSEC) 40 MG capsule    Sig: Take 1 capsule (40 mg total) by mouth daily.    Dispense:  30 capsule    Refill:  3    Order Specific Question:   Supervising Provider    Answer:   Hoy RegisterNEWLIN, ENOBONG [[id number]]    Follow-up: PAP and back pain  Loletta Specteroger David Tod Abrahamsen PA

## 2018-05-28 ENCOUNTER — Other Ambulatory Visit (INDEPENDENT_AMBULATORY_CARE_PROVIDER_SITE_OTHER): Payer: Self-pay | Admitting: Physician Assistant

## 2018-05-28 DIAGNOSIS — K2950 Unspecified chronic gastritis without bleeding: Secondary | ICD-10-CM

## 2018-05-28 DIAGNOSIS — R768 Other specified abnormal immunological findings in serum: Secondary | ICD-10-CM

## 2018-05-28 DIAGNOSIS — K219 Gastro-esophageal reflux disease without esophagitis: Secondary | ICD-10-CM

## 2018-05-28 LAB — COMPREHENSIVE METABOLIC PANEL
A/G RATIO: 1.8 (ref 1.2–2.2)
ALK PHOS: 97 IU/L (ref 39–117)
ALT: 26 IU/L (ref 0–32)
AST: 15 IU/L (ref 0–40)
Albumin: 4.6 g/dL (ref 3.5–5.5)
BILIRUBIN TOTAL: 0.3 mg/dL (ref 0.0–1.2)
BUN / CREAT RATIO: 20 (ref 9–23)
BUN: 10 mg/dL (ref 6–24)
CHLORIDE: 107 mmol/L — AB (ref 96–106)
CO2: 20 mmol/L (ref 20–29)
Calcium: 9 mg/dL (ref 8.7–10.2)
Creatinine, Ser: 0.49 mg/dL — ABNORMAL LOW (ref 0.57–1.00)
GFR calc Af Amer: 139 mL/min/{1.73_m2} (ref >59)
GFR calc non Af Amer: 121 mL/min/{1.73_m2} (ref >59)
Globulin, Total: 2.6 g/dL (ref 1.5–4.5)
Glucose: 81 mg/dL (ref 65–99)
Potassium: 4.3 mmol/L (ref 3.5–5.2)
Sodium: 141 mmol/L (ref 134–144)
Total Protein: 7.2 g/dL (ref 6.0–8.5)

## 2018-05-28 LAB — CBC WITH DIFFERENTIAL/PLATELET
Basophils Absolute: 0 10*3/uL (ref 0.0–0.2)
Basos: 0 %
EOS (ABSOLUTE): 0.1 10*3/uL (ref 0.0–0.4)
EOS: 1 %
HEMATOCRIT: 39.9 % (ref 34.0–46.6)
HEMOGLOBIN: 13.6 g/dL (ref 11.1–15.9)
Immature Grans (Abs): 0 10*3/uL (ref 0.0–0.1)
Immature Granulocytes: 0 %
LYMPHS ABS: 2.6 10*3/uL (ref 0.7–3.1)
Lymphs: 29 %
MCH: 31.1 pg (ref 26.6–33.0)
MCHC: 34.1 g/dL (ref 31.5–35.7)
MCV: 91 fL (ref 79–97)
MONOS ABS: 0.6 10*3/uL (ref 0.1–0.9)
Monocytes: 7 %
Neutrophils Absolute: 5.7 10*3/uL (ref 1.4–7.0)
Neutrophils: 63 %
Platelets: 227 10*3/uL (ref 150–450)
RBC: 4.38 x10E6/uL (ref 3.77–5.28)
RDW: 14.1 % (ref 12.3–15.4)
WBC: 9 10*3/uL (ref 3.4–10.8)

## 2018-05-28 LAB — LIPASE: LIPASE: 43 U/L (ref 14–72)

## 2018-05-28 LAB — H. PYLORI ANTIBODY, IGG: H. pylori, IgG AbS: 3.27 Index Value — ABNORMAL HIGH (ref 0.00–0.79)

## 2018-05-28 MED ORDER — AMOXICILLIN 500 MG PO TABS: 1000.0000 mg | ORAL_TABLET | Freq: Two times a day (BID) | ORAL | 0 refills | Status: DC

## 2018-05-28 MED ORDER — CLARITHROMYCIN 500 MG PO TABS: 500.0000 mg | ORAL_TABLET | Freq: Two times a day (BID) | ORAL | 0 refills | Status: AC

## 2018-05-28 MED ORDER — OMEPRAZOLE 40 MG PO CPDR: 40.0000 mg | DELAYED_RELEASE_CAPSULE | Freq: Two times a day (BID) | ORAL | 0 refills | Status: DC

## 2018-05-30 ENCOUNTER — Ambulatory Visit (HOSPITAL_COMMUNITY)
Admission: EM | Admit: 2018-05-30 | Discharge: 2018-05-30 | Disposition: A | Discharge status: Home / Self Care | Payer: Self-pay | Attending: Family Medicine | Admitting: Family Medicine

## 2018-05-30 ENCOUNTER — Encounter (HOSPITAL_COMMUNITY): Payer: Self-pay | Admitting: Emergency Medicine

## 2018-05-30 ENCOUNTER — Ambulatory Visit (INDEPENDENT_AMBULATORY_CARE_PROVIDER_SITE_OTHER): Payer: Self-pay

## 2018-05-30 DIAGNOSIS — A048 Other specified bacterial intestinal infections: Secondary | ICD-10-CM

## 2018-05-30 DIAGNOSIS — M546 Pain in thoracic spine: Secondary | ICD-10-CM

## 2018-05-30 DIAGNOSIS — G8929 Other chronic pain: Secondary | ICD-10-CM

## 2018-05-30 DIAGNOSIS — R197 Diarrhea, unspecified: Secondary | ICD-10-CM

## 2018-05-30 DIAGNOSIS — R9389 Abnormal findings on diagnostic imaging of other specified body structures: Secondary | ICD-10-CM

## 2018-05-30 NOTE — ED Provider Notes (Signed)
[facility]    CSN: [id number] Arrival date & time: 05/30/18  1339     History   Chief Complaint Chief Complaint  Patient presents with  . Back Pain    HPI Kara Hudson is a 42 y.o. female.   HPI  Patient is here complaining of thoracic back pain.  She states is been persisting for weeks.  She was seen here at the urgent care center in June.  She was found to have an abnormal chest x-ray possible pneumonia.  She was treated with antibiotics.  She follow-up with her PCP, Dr. Lily Kocher.  He advised her to get a follow-up chest x-ray.  This has not yet been done.  She did have blood work at the PCP office.  She knows that 1 of her test was positive.  She does not know what test this was, or what it means.  I did see her today with an interpreter.  I felt it was necessary to explain in detail what was going on. I did do a repeat chest x-ray.  She still has some abnormality on the lateral view.  I told her that this, although it does not look dangerous, does require follow-up with Dr. Lily Kocher. She no longer has any cough, sputum, or fever. Patient did have a positive H. pylori blood test.  Her doctor has called in for her Biaxin to take with her Augmentin, and omeprazole 40 mg twice daily.  I explained these medicines to her.  This is discussed with the interpreter.  I told her that the H. pylori blood test indicates she could have an ulcer causing bacteria contributing to her abdominal pain.  She needs to take these antibiotics as prescribed.  Again, follow-up with Dr. Lily Kocher. She feels like there is something wrong with her thoracic spine.  I reassured her that on both of her chest x-rays her thoracic spine appears normal for age. Patient also states she has had "diarrhea" for 4 weeks.  This is ever since she took the antibiotics for her pneumonia.  She has several loose bowel movements a day.  When asked to produce a stool specimen today in the office, she provided a very small solid  sample that would not be acceptable for C. difficile testing.   Past Medical History:  Diagnosis Date  . Medical history non-contributory     Patient Active Problem List   Diagnosis Date Noted  . History of bilateral tubal ligation 05/27/2018  . Chronic gastritis without bleeding 05/27/2018  . Gastroesophageal reflux disease 05/27/2018  . Pneumonia of right middle lobe due to infectious organism (HCC) 05/27/2018  . Chronic left-sided thoracic back pain 05/27/2018  . Language barrier, speaks Spanish only 07/29/2013    Past Surgical History:  Procedure Laterality Date  . CESAREAN SECTION    . CESAREAN SECTION WITH BILATERAL TUBAL LIGATION Bilateral 02/18/2014   Procedure: CESAREAN SECTION WITH BILATERAL TUBAL LIGATION;  Surgeon: Levie Heritage, DO;  Location: [facility];  Service: Obstetrics;  Laterality: Bilateral;  filshie clips    OB History    Gravida  4   Para  4   Term  4   Preterm  0   AB  0   Living  4     SAB  0   TAB  0   Ectopic  0   Multiple  0   Live Births  4            Home Medications  Prior to Admission medications   Medication Sig Start Date End Date Taking? Authorizing Provider  amoxicillin-clavulanate (AUGMENTIN) 875-125 MG tablet Take 1 tablet by mouth 2 (two) times daily for 10 days. 05/27/18 06/06/18  Loletta Specter, PA-C  clarithromycin (BIAXIN) 500 MG tablet Take 1 tablet (500 mg total) by mouth 2 (two) times daily for 14 days. 05/28/18 06/11/18  Loletta Specter, PA-C  omeprazole (PRILOSEC) 40 MG capsule Take 1 capsule (40 mg total) by mouth 2 (two) times daily for 14 days. 05/28/18 06/11/18  Loletta Specter, PA-C    Family History Family History  Problem Relation Age of Onset  . Hypertension Mother   . Hypertension Son     Social History Social History   Tobacco Use  . Smoking status: Never Smoker  . Smokeless tobacco: Never Used  Substance Use Topics  . Alcohol use: No  . Drug use: No     Allergies   Patient  has no known allergies.   Review of Systems Review of Systems  Constitutional: Negative for chills and fever.  HENT: Negative for ear pain and sore throat.   Eyes: Negative for pain and visual disturbance.  Respiratory: Negative for cough and shortness of breath.   Cardiovascular: Positive for chest pain. Negative for palpitations.       Chest wall pain  Gastrointestinal: Positive for diarrhea. Negative for abdominal pain and vomiting.  Genitourinary: Negative for dysuria and hematuria.  Musculoskeletal: Negative for arthralgias and back pain.  Skin: Negative for color change and rash.  Neurological: Negative for seizures and syncope.  Psychiatric/Behavioral: The patient is nervous/anxious.        Worried about health  All other systems reviewed and are negative.    Physical Exam Triage Vital Signs ED Triage Vitals [05/30/18 1436]  Enc Vitals Group     BP 120/69     Pulse Rate 80     Resp 18     Temp 98 F (36.7 C)     Temp Source Oral     SpO2 99 %   No data found.  Updated Vital Signs BP 120/69 (BP Location: Left Arm)   Pulse 80   Temp 98 F (36.7 C) (Oral)   Resp 18   LMP 05/03/2018 (Exact Date)   SpO2 99%   Physical Exam  Constitutional: She appears well-developed and well-nourished. No distress.  HENT:  Head: Normocephalic and atraumatic.  Right Ear: External ear normal.  Left Ear: External ear normal.  Mouth/Throat: Oropharynx is clear and moist.  Eyes: Pupils are equal, round, and reactive to light. Conjunctivae are normal.  Neck: Normal range of motion.  Cardiovascular: Normal rate, regular rhythm and normal heart sounds.  Pulmonary/Chest: Effort normal and breath sounds normal. No respiratory distress. She has no rales.  Abdominal: Soft. She exhibits no distension.  Musculoskeletal: Normal range of motion. She exhibits no edema.  Neurological: She is alert.  Skin: Skin is warm and dry.  Psychiatric: She has a normal mood and affect. Her behavior is  normal.     UC Treatments / Results  Labs (all labs ordered are listed, but only abnormal results are displayed) Labs Reviewed - No data to display  EKG None  Radiology Dg Chest 2 View  Result Date: 05/30/2018 CLINICAL DATA:  Pneumonia follow-up EXAM: CHEST - 2 VIEW COMPARISON:  Chest radiograph 04/24/2018 FINDINGS: The heart size and mediastinal contours are within normal limits. Right middle lung opacities are unchanged. These are visible only on the  lateral radiograph and may actually be mediastinal. The visualized skeletal structures are unremarkable. IMPRESSION: 1. No acute cardiopulmonary disease. 2. Persistence of opacities of the right middle lobe or mediastinum, visible only on the lateral projection. Chest CT is recommended for more complete characterization and to exclude neoplasm. Electronically Signed   By: Deatra RobinsonKevin  Herman M.D.   On: 05/30/2018 15:28    Procedures Procedures (including critical care time)  Medications Ordered in UC Medications - No data to display  Initial Impression / Assessment and Plan / UC Course  I have reviewed the triage vital signs and the nursing notes.  Pertinent labs & imaging results that were available during my care of the patient were reviewed by me and considered in my medical decision making (see chart for details).     See HPI.  Final Clinical Impressions(s) / UC Diagnoses   Final diagnoses:  Chronic thoracic back pain, unspecified back pain laterality  Abnormal chest x-ray  Positive H. pylori test  Diarrhea, unspecified type     Discharge Instructions     Take acetaminophen for the back pain. Do not take ibuprofen if you have stomach pain. You may take Imodium OTC for the diarrhea.  Make sure you are drinking plenty of fluids. The new medicines you need to be taking are listed on your record, Augmentin plus Biaxin plus Prilosec. Call to make a follow-up with Dr. Lily KocherGomez.  You need to be seen sooner than 1 month Your chest  x-ray has irregular area.  This needs to be followed up. We need a sample of your stool (diarrhea) to test for cause   ED Prescriptions    None     Controlled Substance Prescriptions [place] Controlled Substance Registry consulted? Not Applicable   Eustace MooreNelson, Landra Howze Sue, MD 05/30/18 2133

## 2018-05-30 NOTE — ED Triage Notes (Signed)
Pt told to come here for follow up after having xrays

## 2018-05-30 NOTE — Discharge Instructions (Addendum)
Take acetaminophen for the back pain. Do not take ibuprofen if you have stomach pain. You may take Imodium OTC for the diarrhea.  Make sure you are drinking plenty of fluids. The new medicines you need to be taking are listed on your record, Augmentin plus Biaxin plus Prilosec. Call to make a follow-up with Dr. Lily KocherGomez.  You need to be seen sooner than 1 month Your chest x-ray has irregular area.  This needs to be followed up. We need a sample of your stool (diarrhea) to test for cause

## 2018-05-30 NOTE — ED Notes (Signed)
Pt noted to have positive hpylori in hx with note from PCP; pt also sts diarrhea x 3 weeks

## 2018-05-31 ENCOUNTER — Telehealth (INDEPENDENT_AMBULATORY_CARE_PROVIDER_SITE_OTHER): Payer: Self-pay

## 2018-05-31 NOTE — Telephone Encounter (Signed)
-----   Message from Loletta Specteroger David Gomez, PA-C sent at 05/28/2018  2:39 PM EDT ----- Pt has stomach bacteria. Rest of labs normal. I have sent two antibiotics and an increase in dose of omeprazole (from once a day to twice a day[facility] [address]. Take medications to completion and stop. Do not take anymore omeprazole after 14 days.

## 2018-05-31 NOTE — Telephone Encounter (Signed)
Call placed using pacific interpreter Gunnar Fusiaula [phone]) patients voicemail came on but is full. Unable to leave message. Will try calling again. Maryjean Mornempestt S Roberts, CMA

## 2018-06-04 ENCOUNTER — Encounter (INDEPENDENT_AMBULATORY_CARE_PROVIDER_SITE_OTHER): Payer: Self-pay

## 2018-06-04 ENCOUNTER — Telehealth (INDEPENDENT_AMBULATORY_CARE_PROVIDER_SITE_OTHER): Payer: Self-pay

## 2018-06-04 NOTE — Telephone Encounter (Signed)
Call placed using pacific interpreter [phone] patient voicemail full, unable to leave message. Results mailed. Maryjean Mornempestt S Roberts, CMA

## 2018-06-04 NOTE — Telephone Encounter (Signed)
-----   Message from Roger David Gomez, PA-C sent at 05/28/2018  2:39 PM EDT ----- Pt has stomach bacteria. Rest of labs normal. I have sent two antibiotics and an increase in dose of omeprazole (from once a day to twice a day) to [facility] on [address]. Take medications to completion and stop. Do not take anymore omeprazole after 14 days. 

## 2018-07-01 ENCOUNTER — Other Ambulatory Visit (HOSPITAL_COMMUNITY)
Admission: RE | Admit: 2018-07-01 | Discharge: 2018-07-01 | Disposition: A | Discharge status: Home / Self Care | Payer: Self-pay | Source: Ambulatory Visit | Attending: Physician Assistant | Admitting: Physician Assistant

## 2018-07-01 ENCOUNTER — Encounter (INDEPENDENT_AMBULATORY_CARE_PROVIDER_SITE_OTHER): Payer: Self-pay | Admitting: Physician Assistant

## 2018-07-01 ENCOUNTER — Ambulatory Visit (INDEPENDENT_AMBULATORY_CARE_PROVIDER_SITE_OTHER): Payer: Self-pay | Admitting: Physician Assistant

## 2018-07-01 VITALS — BP 98/61 | HR 77 | Temp 98.4°F | Ht <= 58 in | Wt 119.6 lb

## 2018-07-01 DIAGNOSIS — Z124 Encounter for screening for malignant neoplasm of cervix: Secondary | ICD-10-CM | POA: Insufficient documentation

## 2018-07-01 DIAGNOSIS — R918 Other nonspecific abnormal finding of lung field: Secondary | ICD-10-CM

## 2018-07-01 DIAGNOSIS — Z23 Encounter for immunization: Secondary | ICD-10-CM

## 2018-07-01 DIAGNOSIS — R9389 Abnormal findings on diagnostic imaging of other specified body structures: Secondary | ICD-10-CM

## 2018-07-01 NOTE — Progress Notes (Signed)
   Subjective:  Patient ID: Kara Hudson, female    DOB: 02/09/1976  Age: 42 y.o. MRN: [id number]  CC: PAP and f/u back pain  HPI Kara Hudson is a 42 y.o. female with a medical history of gastritis, PNA, and BTL presents on f/u of left thoracic back pain. Work up revealed positive H pylori IgG Abs. Negative/normal lipase, CBC, and CMP. Patient has finished abx tx for H pylori and states her gastritis and back pain are resolved. Main purpose of visit is to have PAP smear done today. Endorses occasional vaginal discharge. Denies dyspareunia, pelvic pain, abnormal vaginal bleeding, genital lesions, or hx of cervical cancer.    Would also like to know the result of her chest x-ray. There was a persistent opacity in the right middle lobe that would be better characterized with CT. No respiratory symptoms endorsed.    Outpatient Medications Prior to Visit  Medication Sig Dispense Refill  . omeprazole (PRILOSEC) 40 MG capsule Take 1 capsule (40 mg total) by mouth 2 (two) times daily for 14 days. 28 capsule 0   No facility-administered medications prior to visit.      ROS Review of Systems  Constitutional: Negative for chills, fever and malaise/fatigue.  Eyes: Negative for blurred vision.  Respiratory: Negative for shortness of breath.   Cardiovascular: Negative for chest pain and palpitations.  Gastrointestinal: Negative for abdominal pain and nausea.  Genitourinary: Negative for dysuria and hematuria.  Musculoskeletal: Negative for joint pain and myalgias.  Skin: Negative for rash.  Neurological: Negative for tingling and headaches.  Psychiatric/Behavioral: Negative for depression. The patient is not nervous/anxious.     Objective:  BP 98/61   Pulse 77   Temp 98.4 F (36.9 C) (Oral)   Ht 4\' 8"  (1.422 m)   Wt 119 lb 9.6 oz (54.3 kg)   SpO2 98%   BMI 26.81 kg/m   BP/Weight 07/01/2018 05/30/2018 05/27/2018  Systolic BP 98 120 120  Diastolic BP 61 69 76  Wt. (Lbs) 119.6 - 116   BMI 26.81 - 26.01      Physical Exam  Constitutional: She is oriented to person, place, and time.  Well developed, well nourished, NAD, polite  HENT:  Head: Normocephalic and atraumatic.  Eyes: No scleral icterus.  Neck: Normal range of motion. Neck supple. No thyromegaly present.  Pulmonary/Chest: Effort normal and breath sounds normal.  Abdominal: Soft. Bowel sounds are normal. There is no tenderness.  Genitourinary:  Genitourinary Comments: Mild whitish vaginal discharge. Mild cervical erythema around os. No adnexal TTP or mass felt bilaterally. No uterine mass or tenderness.   Musculoskeletal: She exhibits no edema.  Back with full aROM  Neurological: She is alert and oriented to person, place, and time.  Skin: Skin is warm and dry. No rash noted. No erythema. No pallor.  Psychiatric: She has a normal mood and affect. Her behavior is normal. Thought content normal.  Vitals reviewed.    Assessment & Plan:    1. Screening for cervical cancer - Cytology - PAP([place])  2. Need for prophylactic vaccination and inoculation against influenza - Flu Vaccine QUAD 6+ mos PF IM (Fluarix Quad PF)  3. Abnormal chest x-ray - CT Chest W Contrast; Future  4. Mass of middle lobe of right lung - CT Chest W Contrast; Future     Follow-up: Return if symptoms worsen or fail to improve.   Loletta Specteroger David Gomez PA

## 2018-07-01 NOTE — Patient Instructions (Addendum)
Ndulo pulmonar (Pulmonary Nodule) Un ndulo pulmonar es un bulto redondo y pequeo en el tejido del pulmn. Pueden variar en forma desde menos de 1/5 de pulgada (4 mm) a un poco ms de una pulgada (25 mm). La mayora de los ndulos pulmonares se detectan cuando se hacen estudios por imgenes del pulmn por un problema diferente. Los ndulos pulmonares generalmente no son cancerosos (son benignos). Sin embargo, algunos pueden ser Time Warnercancerosos (malignos). El tratamiento o los estudios siguientes se basarn en el tamao del ndulo pulmonar y en el riesgo de contraer cncer de pulmn. CAUSAS Las causas de los ndulos pulmonares benignos pueden ser Spurgeondiferentes. Algunas causas son:  Infecciones virales fngicas o bacterianas. En general se trata de una infeccin Turkeyantigua que ya no es Concordactiva, pero que en algunos casos puede ser Neomia Dearuna infeccin actual y Beverly Milchactiva.  Una masa benigna de tejido.  Inflamacin por enfermedades como la artritis reumatoidea.  Vasos sanguneos anormales en los pulmones. Ndulos pulmonares malignos pueden ser el resultado de un cncer de pulmn o de otro tipo de cncer que se extiende al pulmn desde otros lugares del cuerpo. SIGNOS Y SNTOMAS La mayora de los ndulos pulmonares no causan sntomas. DIAGNSTICO Con ms frecuencia, los ndulos pulmonares se encuentran incidentalmente cuando se toma una radiografa o una tomografa computada para diagnosticar otros problemas en la zona del pulmn. Para ayudar a determinar si un ndulo pulmonar es benigno o Rochestermaligno, el mdico le har una historia clnica e indicar varios estudios. Los estudios pueden incluir:  Anlisis de Lahomasangre.  Una prueba que se realiza en la piel llamada prueba de tuberculina. Esta prueba se Bots[place]ausa para determinar si ha estado expuesto al germen que causa la tuberculosis.  Radiografa de trax. Si es posible, una nueva radiografa puede compararse con otras tomadas en el pasado.  Tomografa computada. Este estudio  muestra ndulos pulmonares ms pequeos que una radiografa.  Tomografa por emisin de positrones (PET). En este estudio, una cantidad segura de sustancia radioactiva se inyecta en la sangre. Luego, se toman imgenes del ndulo pulmonar. La sustancia radioactiva es eliminada del organismo a travs de la [place].  Biopsia. Un pequeo trozo de ndulo pulmonar se extirpa de modo que pueda observarse en el microscopio TRATAMIENTO Los ndulos pulmonares benignos normalmente no requieren tratamiento debido a que generalmente no causan sntomas ni problemas respiratorios. El mdico controlar el ndulo pulmonar con controles realizados con tomografas computadas. La frecuencia con que indicar las radiografas variar segn el tamao del ndulo y los factores de riesgo para el cncer de pulmn. Por ejemplo, ser necesario realizar tomografas con ms frecuencia si el ndulo pulmonar es grande y si tiene antecedentes de fumador y [place]a historia familiar de cncer. Otros estudios o biopsias pueden indicarse si alguna de las tomografas de control muestran que el ndulo pulmonar ha aumentado de Mexicotamao. INSTRUCCIONES PARA EL CUIDADO EN EL HOGAR  Tome slo medicamentos de venta libre o recetados, segn las indicaciones del mdico.  Cumpla con todas las visitas de control, segn le indique su mdico.  SOLICITE ATENCIN MDICA SI:  Tiene dificultad para respirar cuando realiza actividades.  Se siente muy cansado o enfermo.  No tiene ganas de comer.  Pierde peso sin causa.  Siente escalofros, fiebre o sudores nocturnos.  SOLICITE ATENCIN MDICA DE INMEDIATO SI:  No puede respirar o comienza a tener sibilancias.  No puede dejar de toser.  Tose y escupe sangre.  Se siente mareado o como si se fuera a desmayar.  Siente sbitamente un dolor  en el pecho.  Tiene fiebre o sntomas persistentes durante ms de 2 - 3 das.  Tiene fiebre y los sntomas empeoran repentinamente.  ASEGRESE DE  QUE:  Comprende estas instrucciones.  Controlar su afeccin.  Recibir ayuda de inmediato si no mejora o si empeora.  Esta informacin no tiene Theme park managercomo fin reemplazar el consejo del mdico. Asegrese de hacerle al mdico cualquier pregunta que tenga. Document Released: 07/02/2013 Document Revised: 07/02/2013 Document Reviewed: 04/21/2013 Elsevier Interactive Patient Education  2017 Elsevier Inc.   Prueba de Papanicolaou (Pap Test) POR QU ME DEBO REALIZAR ESTA PRUEBA? A esta prueba tambin se la denomina "frotis de Pap". Es una prueba de deteccin que se [place] ([place] Islandsutiliza para Engineer, manufacturingdetectar signos de cncer de vagina, cuello del tero y tero. La prueba tambin puede identificar la presencia de infeccin o cambios precancerosos. El mdico probablemente le recomiende que se realice esta prueba en forma regular. Esta prueba puede realizarse de la siguiente manera:  Cada 3 aos, a partir de los [address] aos.  Cada 5 aos, en combinacin con las pruebas que se realizan para Landscape architectdetectar la presencia del virus del Geneticist, molecularpapiloma humano (VPH).  Con mayor o menor frecuencia, en funcin de otras enfermedades que tenga. QU TIPO DE MUESTRA SE TOMA? El mdico utilizar un pequeo hisopo de algodn, una esptula de plstico o un cepillo para Landscape architectrecolectar una muestra de clulas de la superficie del cuello del tero. El cuello del tero es la apertura del tero, que tambin se conoce como matriz. Tambin pueden recolectarse las secreciones del cuello del tero y la vagina. CMO DEBO PREPARARME PARA ESTA PRUEBA?  Tenga en cuenta en qu etapa del ciclo menstrual se encuentra. Es posible que Human resources officerdeba reprogramar la prueba si est Magazine features editormenstruando el da en que debe realizrsela.  Si el da en que debe realizarse la prueba tiene una infeccin vaginal aparente, deber reprogramar la prueba.  Pueden pedirle que evite tomar una ducha o bao el da de la prueba o el da anterior.  Algunos medicamentos pueden The ServiceMaster Companyprovocar resultados anormales de  la prueba, como los digitlicos y Regulatory affairs officerla tetraciclina. Si toma alguno de Ford Motor Companyestos medicamentos, hable con su mdico antes de Smurfit-Stone Containerrealizarse la prueba. QU SIGNIFICAN LOS RESULTADOS? Los Cox Communicationsresultados anormales de la prueba pueden indicar diversas enfermedades. Estas pueden incluir lo siguiente:  Cncer. Si bien los resultados de la prueba de Papanicolaou no pueden utilizarse para Consulting civil engineerdiagnosticar cncer de cuello del tero, de vagina o de tero, pueden indicar que existe una posibilidad de presencia de cncer. En Maurilio Lovelyeste caso, ser necesario realizar pruebas adicionales para determinar la presencia de cncer.  Enfermedad de transmisin sexual.  Infecciones por hongos.  Infeccin por parsitos.  Infeccin por herpes.  Una enfermedad que causa o favorece la infertilidad. Es su responsabilidad retirar el resultado del Langstonestudio. Consulte en el laboratorio o en el departamento en el que fue realizado el estudio cundo y cmo podr Starbucks Corporationobtener los resultados. Comunquese con el mdico si tiene Smith Internationalpreguntas sobre los resultados. Esta informacin no tiene Theme park managercomo fin reemplazar el consejo del mdico. Asegrese de hacerle al mdico cualquier pregunta que tenga. Document Released: 04/17/2008 Document Revised: 11/20/2014 Document Reviewed: 03/23/2014 Elsevier Interactive Patient Education  [employer]2018 Elsevier Inc.

## 2018-07-02 ENCOUNTER — Other Ambulatory Visit (INDEPENDENT_AMBULATORY_CARE_PROVIDER_SITE_OTHER): Payer: Self-pay | Admitting: Physician Assistant

## 2018-07-02 DIAGNOSIS — B3731 Acute candidiasis of vulva and vagina: Secondary | ICD-10-CM

## 2018-07-02 DIAGNOSIS — B373 Candidiasis of vulva and vagina: Secondary | ICD-10-CM

## 2018-07-02 LAB — CYTOLOGY - PAP
BACTERIAL VAGINITIS: NEGATIVE
CHLAMYDIA, DNA PROBE: NEGATIVE
Candida vaginitis: POSITIVE — AB
DIAGNOSIS: NEGATIVE
NEISSERIA GONORRHEA: NEGATIVE
TRICH (WINDOWPATH): NEGATIVE

## 2018-07-02 MED ORDER — FLUCONAZOLE 150 MG PO TABS: 150.0000 mg | ORAL_TABLET | Freq: Once | ORAL | 0 refills | Status: AC

## 2018-07-03 ENCOUNTER — Telehealth (INDEPENDENT_AMBULATORY_CARE_PROVIDER_SITE_OTHER): Payer: Self-pay

## 2018-07-03 NOTE — Telephone Encounter (Signed)
Call placed using pacific interpreter Alejandro([phone]) left voicemail notifying patient that pap is negative for malignancy but positive for yeast. abx has been sent to [facility] on [address] Call [facility] with any questions or concerns at [phone]. Maryjean Mornempestt S Judea Riches, CMA

## 2018-07-03 NOTE — Telephone Encounter (Signed)
-----   Message from Loletta Specteroger David Gomez, PA-C sent at 07/02/2018  5:47 PM EDT ----- Negative for malignancy. Positive for yeast. Sent fluconazole to [facility] [address].

## 2018-07-05 ENCOUNTER — Ambulatory Visit (HOSPITAL_COMMUNITY)
Admission: RE | Admit: 2018-07-05 | Discharge: 2018-07-05 | Disposition: A | Discharge status: Home / Self Care | Payer: Self-pay | Source: Ambulatory Visit | Attending: Physician Assistant | Admitting: Physician Assistant

## 2018-07-05 DIAGNOSIS — R9389 Abnormal findings on diagnostic imaging of other specified body structures: Secondary | ICD-10-CM | POA: Insufficient documentation

## 2018-07-05 DIAGNOSIS — R918 Other nonspecific abnormal finding of lung field: Secondary | ICD-10-CM | POA: Insufficient documentation

## 2018-07-05 MED ORDER — IOHEXOL 300 MG/ML  SOLN
75.0000 mL | Freq: Once | INTRAMUSCULAR | Status: AC | PRN
  Administered 2018-07-05: 75 mL via INTRAVENOUS

## 2018-07-10 ENCOUNTER — Telehealth (INDEPENDENT_AMBULATORY_CARE_PROVIDER_SITE_OTHER): Payer: Self-pay

## 2018-07-10 NOTE — Telephone Encounter (Signed)
-----   Message from Loletta Specteroger David Gomez, PA-C sent at 07/08/2018  8:34 AM EDT ----- No malignancy found. Opacities seem to be scarring likely from previous infection/inflammation.

## 2018-07-10 NOTE — Telephone Encounter (Signed)
Call placed using pacific interpreter [phone]) patient voicemail full unable to leave message. Will attempt to call once more before mailing results. Maryjean Mornempestt S Roberts, CMA

## 2018-07-11 ENCOUNTER — Telehealth (INDEPENDENT_AMBULATORY_CARE_PROVIDER_SITE_OTHER): Payer: Self-pay

## 2018-07-11 ENCOUNTER — Encounter (INDEPENDENT_AMBULATORY_CARE_PROVIDER_SITE_OTHER): Payer: Self-pay

## 2018-07-11 NOTE — Telephone Encounter (Signed)
-----   Message from Roger David Gomez, PA-C sent at 07/08/2018  8:34 AM EDT ----- No malignancy found. Opacities seem to be scarring likely from previous infection/inflammation. 

## 2018-07-11 NOTE — Telephone Encounter (Signed)
Call placed using pacific interpreter [phone]([phone]) patient voicemail is full. Results mailed. Maryjean Mornempestt S Roberts, CMA

## 2019-12-23 ENCOUNTER — Ambulatory Visit (HOSPITAL_COMMUNITY)
Admission: EM | Admit: 2019-12-23 | Discharge: 2019-12-23 | Disposition: A | Discharge status: Home / Self Care | Payer: Self-pay | Attending: Urgent Care | Admitting: Urgent Care

## 2019-12-23 ENCOUNTER — Other Ambulatory Visit: Payer: Self-pay

## 2019-12-23 ENCOUNTER — Encounter (HOSPITAL_COMMUNITY): Payer: Self-pay

## 2019-12-23 DIAGNOSIS — G8929 Other chronic pain: Secondary | ICD-10-CM

## 2019-12-23 DIAGNOSIS — M546 Pain in thoracic spine: Secondary | ICD-10-CM

## 2019-12-23 DIAGNOSIS — R0789 Other chest pain: Secondary | ICD-10-CM

## 2019-12-23 MED ORDER — MELOXICAM 7.5 MG PO TABS: 7.5000 mg | ORAL_TABLET | Freq: Every day | ORAL | 0 refills | Status: DC

## 2019-12-23 MED ORDER — TIZANIDINE HCL 4 MG PO TABS: 4.0000 mg | ORAL_TABLET | Freq: Four times a day (QID) | ORAL | 0 refills | Status: DC | PRN

## 2019-12-23 NOTE — ED Provider Notes (Signed)
[place]   MRN: [id number] DOB: [birth year]  Subjective:   Kara Hudson is a 44 y.o. female presenting for 2-day history of recurrent left-sided thoracic back pain and intermittent upper left-sided chest pain.  Patient has a history of chronic thoracic back pain.  She was last seen here June and July 2019.  She was treated for pneumonia but due to abnormal chest x-ray was recommended to have a chest CT scan.  This was done with PA Altamease Oiler in August 2019.  No malignancy was found but patient did not have review of her CT.  She would like this done today.  She also does not know about the labs that were drawn.  She did get tested and treated for H. pylori through antibody blood test.  She states that she completed treatment and has not had any symptoms.  Today, she reports that her pain is very much the same as it was before.  Denies falls, trauma.  Denies bruising, rash.  Denies fever, nausea, vomiting, belly pain, wheezing or shortness of breath.  She tries to hydrate with at least 2 bottles of water a day.  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  No current facility-administered medications for this encounter.  Current Outpatient Medications:  .  omeprazole (PRILOSEC) 40 MG capsule, Take 1 capsule (40 mg total) by mouth 2 (two) times daily for 14 days., Disp: 28 capsule, Rfl: 0   No Known Allergies  Past Medical History:  Diagnosis Date  . Medical history non-contributory      Past Surgical History:  Procedure Laterality Date  . CESAREAN SECTION    . CESAREAN SECTION WITH BILATERAL TUBAL LIGATION Bilateral 02/18/2014   Procedure: CESAREAN SECTION WITH BILATERAL TUBAL LIGATION;  Surgeon: Truett Mainland, DO;  Location: [facility];  Service: Obstetrics;  Laterality: Bilateral;  filshie clips    Family History  Problem Relation Age of Onset  . Hypertension Mother   . Hypertension Son     Social History   Tobacco Use  . Smoking  status: Never Smoker  . Smokeless tobacco: Never Used  Substance Use Topics  . Alcohol use: No  . Drug use: No    ROS   Objective:   Vitals: BP 115/60 (BP Location: Left Arm)   Pulse 92   Temp 98.6 F (37 C) (Oral)   Resp 16   SpO2 100%   Physical Exam Constitutional:      General: She is not in acute distress.    Appearance: Normal appearance. She is well-developed. She is not ill-appearing, toxic-appearing or diaphoretic.  HENT:     Head: Normocephalic and atraumatic.     Nose: Nose normal.     Mouth/Throat:     Mouth: Mucous membranes are moist.     Pharynx: Oropharynx is clear.  Eyes:     General: No scleral icterus.    Extraocular Movements: Extraocular movements intact.     Pupils: Pupils are equal, round, and reactive to light.  Cardiovascular:     Rate and Rhythm: Normal rate and regular rhythm.     Pulses: Normal pulses.     Heart sounds: Normal heart sounds. No murmur. No friction rub. No gallop.   Pulmonary:     Effort: Pulmonary effort is normal. No respiratory distress.     Breath sounds: Normal breath sounds. No stridor. No wheezing, rhonchi or rales.  Chest:     Chest wall: Tenderness (Reproducible  tenderness over area outlined) present.    Musculoskeletal:     Thoracic back: Spasms and tenderness (over area outlined) present. No swelling, edema, deformity, signs of trauma, lacerations or bony tenderness. Normal range of motion. No scoliosis.       Back:  Skin:    General: Skin is warm and dry.     Findings: No rash.  Neurological:     General: No focal deficit present.     Mental Status: She is alert and oriented to person, place, and time.  Psychiatric:        Mood and Affect: Mood normal.        Behavior: Behavior normal.        Thought Content: Thought content normal.        Judgment: Judgment normal.      Assessment and Plan :   1. Acute left-sided thoracic back pain   2. Chronic left-sided thoracic back pain   3. Chest wall pain     Counseled against repeat x-ray given lack of trauma.  No recommendation was made for repeat CT scan from 2019.  Counseled patient that I suspect her pain is chronic in nature because of her strenuous work activities and the fact that she works 7 days a week.  She is also not taking any medications and not hydrating with water adequately.  Recommended cutting back her schedule to working 5 days a week.  Start meloxicam and muscle relaxant for conservative management.  Reviewed labs with patient, she is to return to clinic if she has made lifestyle modifications and has taken medications I prescribed; at that point repeating a chest x-ray would be appropriate.  Patient is agreeable to this. Counseled patient on potential for adverse effects with medications prescribed/recommended today, ER and return-to-clinic precautions discussed, patient verbalized understanding.    Wallis Bamberg, [place] 12/24/19 [phone]

## 2019-12-23 NOTE — ED Triage Notes (Signed)
Patient presents to Urgent Care with complaints of left sided back pain since 2 days ago. Patient reports it hurts more when she takes a deep breath.

## 2019-12-27 ENCOUNTER — Ambulatory Visit (INDEPENDENT_AMBULATORY_CARE_PROVIDER_SITE_OTHER): Payer: Self-pay

## 2019-12-27 ENCOUNTER — Encounter (HOSPITAL_COMMUNITY): Payer: Self-pay

## 2019-12-27 ENCOUNTER — Ambulatory Visit (HOSPITAL_COMMUNITY)
Admission: EM | Admit: 2019-12-27 | Discharge: 2019-12-27 | Disposition: A | Discharge status: Home / Self Care | Payer: Self-pay | Attending: Urgent Care | Admitting: Urgent Care

## 2019-12-27 DIAGNOSIS — J90 Pleural effusion, not elsewhere classified: Secondary | ICD-10-CM

## 2019-12-27 DIAGNOSIS — R059 Cough, unspecified: Secondary | ICD-10-CM

## 2019-12-27 DIAGNOSIS — G8929 Other chronic pain: Secondary | ICD-10-CM

## 2019-12-27 DIAGNOSIS — R079 Chest pain, unspecified: Secondary | ICD-10-CM

## 2019-12-27 DIAGNOSIS — M546 Pain in thoracic spine: Secondary | ICD-10-CM

## 2019-12-27 DIAGNOSIS — R05 Cough: Secondary | ICD-10-CM

## 2019-12-27 MED ORDER — PREDNISONE 20 MG PO TABS: ORAL_TABLET | ORAL | 0 refills | Status: DC

## 2019-12-27 NOTE — ED Triage Notes (Addendum)
Pt presents to UC with left sided back pain x 4 days. Pt states, when she takes a deep breath she has left sided chest pain. Pt reports she was seen for the same chief complaint 2 days ago. Pt reports she had a negative COVID test 3 days ago. Pt reports having left ear pain and cough x 2 days.

## 2019-12-27 NOTE — ED Provider Notes (Signed)
[facility]   MRN: [id number] DOB: 1976-11-03  Subjective:   Kara Hudson is a 44 y.o. female presenting for recheck on ongoing upper left-sided chest pain and left-sided thoracic back pain.  Now having a cough and and left-sided ear pain.  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  She has previously had abnormal x-rays and led up to a chest CT in 07/06/2018.  Chest CT showed, "Mild opacities with dilated bronchi are noted in the lingular segment of left upper lobe and right middle lobe most consistent with scarring or subsegmental atelectasis. No other abnormality seen in the chest."  She has not had follow-up since then.  Patient was also treated for acid reflux with omeprazole.  No current facility-administered medications for this encounter.  Current Outpatient Medications:  .  meloxicam (MOBIC) 7.5 MG tablet, Take 1 tablet (7.5 mg total) by mouth daily., Disp: 30 tablet, Rfl: 0 .  omeprazole (PRILOSEC) 40 MG capsule, Take 1 capsule (40 mg total) by mouth 2 (two) times daily for 14 days., Disp: 28 capsule, Rfl: 0 .  tiZANidine (ZANAFLEX) 4 MG tablet, Take 1 tablet (4 mg total) by mouth every 6 (six) hours as needed for muscle spasms., Disp: 30 tablet, Rfl: 0   No Known Allergies  Past Medical History:  Diagnosis Date  . Medical history non-contributory      Past Surgical History:  Procedure Laterality Date  . CESAREAN SECTION    . CESAREAN SECTION WITH BILATERAL TUBAL LIGATION Bilateral 02/18/2014   Procedure: CESAREAN SECTION WITH BILATERAL TUBAL LIGATION;  Surgeon: Levie Heritage, DO;  Location: [facility];  Service: Obstetrics;  Laterality: Bilateral;  filshie clips    Family History  Problem Relation Age of Onset  . Hypertension Mother   . Hypertension Son     Social  History   Tobacco Use  . Smoking status: Never Smoker  . Smokeless tobacco: Never Used  Substance Use Topics  . Alcohol use: No  . Drug use: No    ROS   Objective:   Vitals: BP 126/84 (BP Location: Left Arm)   Pulse 65   Temp 98.8 F (37.1 C) (Oral)   Resp 20   LMP 12/16/2019 (Approximate) Comment: 1 week  SpO2 98%   Physical Exam Constitutional:      General: She is not in acute distress.    Appearance: Normal appearance. She is well-developed. She is not ill-appearing, toxic-appearing or diaphoretic.  HENT:     Head: Normocephalic and atraumatic.     Nose: Nose normal.     Mouth/Throat:     Mouth: Mucous membranes are moist.  Eyes:     Extraocular Movements: Extraocular movements intact.     Pupils: Pupils are equal, round, and reactive to light.  Cardiovascular:     Rate and Rhythm: Normal rate and regular rhythm.     Pulses: Normal pulses.     Heart sounds: Normal heart sounds. No murmur. No friction rub. No gallop.   Pulmonary:     Effort: Pulmonary effort is normal. No respiratory distress.     Breath sounds: Normal breath sounds. No stridor. No wheezing, rhonchi or rales.  Chest:    Musculoskeletal:       Back:  Skin:    General: Skin is warm and dry.  Findings: No rash.  Neurological:     Mental Status: She is alert and oriented to person, place, and time.  Psychiatric:        Mood and Affect: Mood normal.        Behavior: Behavior normal.        Thought Content: Thought content normal.        Judgment: Judgment normal.     DG Chest 2 View  Result Date: 12/27/2019 CLINICAL DATA:  Chest pain since Tuesday EXAM: CHEST - 2 VIEW COMPARISON:  May 28, 2018 FINDINGS: The mediastinal contour and cardiac silhouette are normal. Minimal left pleural effusion is noted. There is no focal pneumonia. no pulmonary edema is noted. The bony structures normal. IMPRESSION: Minimal left pleural effusion. No focal pneumonia. Electronically Signed   By: Abelardo Diesel M.D.   On: 12/27/2019 14:17    Assessment and Plan :   1. Left-sided chest pain   2. Chronic left-sided thoracic back pain   3. Cough   4. Pleural effusion     Case discussed with Dr. Mannie Stabile. Reviewed differential with patient. Counseled that if she develops worsening shob, chest pain will need to have a chest CT angiogram and/or scan to further evaluate in ER. In the meantime, will use a short steroid course to address severe chest wall strain as patient states her shob is worse when she moves and has pain of her ribs. She plans on following with her previous PCP, pulmonology otherwise. Counseled patient on potential for adverse effects with medications prescribed/recommended today, ER and return-to-clinic precautions discussed, patient verbalized understanding.    Jaynee Eagles, PA-C 12/27/19 [id number]

## 2020-01-01 ENCOUNTER — Other Ambulatory Visit: Payer: Self-pay

## 2020-01-01 ENCOUNTER — Ambulatory Visit (INDEPENDENT_AMBULATORY_CARE_PROVIDER_SITE_OTHER): Payer: Self-pay | Admitting: Primary Care

## 2020-01-14 ENCOUNTER — Encounter (INDEPENDENT_AMBULATORY_CARE_PROVIDER_SITE_OTHER): Payer: Self-pay | Admitting: Primary Care

## 2020-01-14 ENCOUNTER — Ambulatory Visit (INDEPENDENT_AMBULATORY_CARE_PROVIDER_SITE_OTHER): Payer: Self-pay | Admitting: Primary Care

## 2020-01-14 ENCOUNTER — Other Ambulatory Visit: Payer: Self-pay

## 2020-01-14 VITALS — BP 112/73 | HR 84 | Temp 97.3°F | Ht <= 58 in | Wt 123.2 lb

## 2020-01-14 DIAGNOSIS — Z09 Encounter for follow-up examination after completed treatment for conditions other than malignant neoplasm: Secondary | ICD-10-CM

## 2020-01-14 DIAGNOSIS — J90 Pleural effusion, not elsewhere classified: Secondary | ICD-10-CM

## 2020-01-14 DIAGNOSIS — M545 Low back pain, unspecified: Secondary | ICD-10-CM

## 2020-01-14 NOTE — Patient Instructions (Signed)
Derrame pleural Pleural Effusion El derrame pleural es una acumulacin anormal de lquido en las capas de tejido que se encuentran entre los pulmones y el interior del trax (espacio pleural). [employer] capas de tejido que recubren los pulmones y el interior del trax se llaman pleura. Generalmente, en el espacio de la pleura no hay aire, solo hay una fina capa de lquido. Algunas afecciones pueden causar que se acumule una gran cantidad de lquido y, si no se trata, esa acumulacin puede provocar el colapso del pulmn. Por lo general, un derrame pleural se produce por otra enfermedad que requiere tratamiento. Cules son las causas? Las causas del derrame pleural pueden ser las siguientes:  Insuficiencia cardaca.  Ciertas infecciones, como la neumona o la tuberculosis.  Cncer.  Un cogulo de sangre en el pulmn (embolia pulmonar).  Complicaciones de [place], por ejemplo, [place] a corazn abierto.  Enfermedad heptica (cirrosis).  Enfermedad renal. Cules son los signos o los sntomas? En algunos casos, el derrame pleural no produce sntomas. Si se presentan sntomas, estos pueden incluir lo siguiente:  Dificultad respiratoria, en especial al estar recostado.  Dolor en el pecho. Ese dolor puede empeorar al respirar profundo.  Grant Ruts.  Tos seca prolongada (crnica).  Hipo.  Respiracin rpida. La enfermedad subyacente que causa el derrame pleural (como insuficiencia cardaca, neumona, cogulos de sangre, tuberculosis o cncer) tambin puede causar otros sntomas. Cmo se diagnostica? Esta afeccin se puede diagnosticar en funcin de lo siguiente:  Los sntomas y antecedentes mdicos.  Un examen fsico.  Una radiografa de trax.  Un procedimiento en el que se [place] una aguja para extraer lquido del espacio pleural (toracocentesis). Este lquido se Chief of Staff.  Otros estudios de diagnstico por imgenes del trax, como una ecografa o una exploracin por tomografa  computarizada (TC). Cmo se trata? Dependiendo de la causa de la afeccin, el tratamiento puede incluir lo siguiente:  Warehouse manager la afeccin subyacente que causa el derrame. Cuando la afeccin mejore, el derrame tambin mejorar. Algunos ejemplos de tratamiento para afecciones subyacentes pueden ser los siguientes: ? Antibiticos para tratar una infeccin. ? Diurticos u otros medicamentos para el corazn para tratar la insuficiencia cardaca.  Toracocentesis.  Colocacin de un tubo delgado y flexible debajo de la piel y en el interior del trax para drenar continuamente el derrame (catter pleural [place]).  Ciruga para extirpar la capa externa de tejido del espacio pleural (pleurectoma).  Un procedimiento que se utiliza para Passenger transport manager en la cavidad torcica para sellar el espacio pleural a fin de evitar la acumulacin de lquido (pleurodesis).  Quimioterapia y radioterapia, si el derrame pleural es canceroso (neoplasia maligna). Estos tratamientos se utilizan normalmente para Animator. Destruyen ciertas clulas del organismo. Siga estas indicaciones en su casa:  Tome los medicamentos de venta libre y los recetados solamente como se lo haya indicado el mdico.  Pregntele al mdico qu actividades son seguras para usted.  Mantenga un registro de Theatre manager ejercicio leve (como caminar) antes de que le falte el aire. Marcelino Freestone esa informacin para compartirla con el mdico. Su capacidad de hacer ejercicio fsico debera mejorar con el paso del [place].  No consuma ningn producto que contenga nicotina o tabaco, como cigarrillos y Administrator, Civil Service. Si necesita ayuda para dejar de fumar, consulte al mdico.  Concurra a todas las visitas de seguimiento como se lo haya indicado el mdico. Esto es importante. Comunquese con un mdico si:  La cantidad de tiempo que puede hacer ejercicio leve: ? Disminuye. ?  No mejora con el paso del tiempo.  Tiene  fiebre. Solicite ayuda de inmediato si:  Le falta el aire.  Siente dolor en el pecho.  Comienza a tener tos. Resumen  Un derrame pleural es una acumulacin anormal de lquido en las capas de tejido que se encuentran entre los pulmones y el interior del trax.  El derrame pleural puede tener muchas causas, entre las que se incluyen insuficiencia cardaca, embolia pulmonar, infecciones y cncer.  Algunos sntomas de derrame pleural pueden ser dificultad para respirar, dolor en el pecho, fiebre, tos prolongada (crnica), hipo y respiracin rpida.  Para realizar el diagnstico, suele tomarse imgenes del trax (como una ecografa o Runner, broadcasting/film/video) y extraer lquido (toracocentesis) que se enva a Physiological scientist.  El tratamiento del derrame pleural depende de la afeccin subyacente que lo cause. Esta informacin no tiene Marine scientist el consejo del mdico. Asegrese de hacerle al mdico cualquier pregunta que tenga. Document Revised: 10/17/2017 Document Reviewed: 10/17/2017 Elsevier Patient Education  2020 [employer].

## 2020-01-14 NOTE — Progress Notes (Signed)
Established Patient Office Visit  Subjective:  Patient ID: Kara Hudson, female    DOB: 03/29/1976  Age: 44 y.o. MRN: [id number]  CC:  Chief Complaint  Patient presents with  . Back Pain    HPI Ellanie Oppedisano presents for chronic back pain and just will not go away. She works as a Engineer, production. Seen in urgent care 2/132021 for left sided chest pain and 12/23/2019 Acute/chronic  left sided thoracic back pain and chest wall pain. . Past Medical History:  Diagnosis Date  . Medical history non-contributory     Past Surgical History:  Procedure Laterality Date  . CESAREAN SECTION    . CESAREAN SECTION WITH BILATERAL TUBAL LIGATION Bilateral 02/18/2014   Procedure: CESAREAN SECTION WITH BILATERAL TUBAL LIGATION;  Surgeon: Levie Heritage, DO;  Location: [facility];  Service: Obstetrics;  Laterality: Bilateral;  filshie clips    Family History  Problem Relation Age of Onset  . Hypertension Mother   . Hypertension Son     Social History   Socioeconomic History  . Marital status: Legally Separated    Spouse name: Not on file  . Number of children: Not on file  . Years of education: Not on file  . Highest education level: Not on file  Occupational History  . Not on file  Tobacco Use  . Smoking status: Never Smoker  . Smokeless tobacco: Never Used  Substance and Sexual Activity  . Alcohol use: No  . Drug use: No  . Sexual activity: Not Currently    Partners: Male    Birth control/protection: Condom  Other Topics Concern  . Not on file  Social History Narrative  . Not on file   Social Determinants of Health   Financial Resource Strain:   . Difficulty of Paying Living Expenses: Not on file  Food Insecurity:   . Worried About Programme researcher, broadcasting/film/video in the Last Year: Not on file  . Ran Out of Food in the Last Year: Not on file  Transportation Needs:   . Lack of Transportation (Medical): Not on file  . Lack of Transportation (Non-Medical):  Not on file  Physical Activity:   . Days of Exercise per Week: Not on file  . Minutes of Exercise per Session: Not on file  Stress:   . Feeling of Stress : Not on file  Social Connections:   . Frequency of Communication with Friends and Family: Not on file  . Frequency of Social Gatherings with Friends and Family: Not on file  . Attends Religious Services: Not on file  . Active Member of Clubs or Organizations: Not on file  . Attends Banker Meetings: Not on file  . Marital Status: Not on file  Intimate Partner Violence:   . Fear of Current or Ex-Partner: Not on file  . Emotionally Abused: Not on file  . Physically Abused: Not on file  . Sexually Abused: Not on file    Outpatient Medications Prior to Visit  Medication Sig Dispense Refill  . meloxicam (MOBIC) 7.5 MG tablet Take 1 tablet (7.5 mg total) by mouth daily. 30 tablet 0  . tiZANidine (ZANAFLEX) 4 MG tablet Take 1 tablet (4 mg total) by mouth every 6 (six) hours as needed for muscle spasms. 30 tablet 0  . omeprazole (PRILOSEC) 40 MG capsule Take 1 capsule (40 mg total) by mouth 2 (two) times daily for 14 days. 28 capsule 0  . predniSONE (DELTASONE) 20 MG tablet  Take 2 tablets daily with breakfast. 10 tablet 0   No facility-administered medications prior to visit.    No Known Allergies  ROS Review of Systems  Musculoskeletal: Positive for back pain.  All other systems reviewed and are negative.     Objective:    Physical Exam  Constitutional: She is oriented to person, place, and time. She appears well-developed and well-nourished.  HENT:  Head: Normocephalic.  Eyes: Pupils are equal, round, and reactive to light. EOM are normal.  Cardiovascular: Normal rate and regular rhythm.  Pulmonary/Chest: Effort normal and breath sounds normal.  Abdominal: Soft. Bowel sounds are normal.  Musculoskeletal:        General: Normal range of motion.     Cervical back: Normal range of motion and neck supple.   Neurological: She is oriented to person, place, and time. She has normal reflexes.  Skin: Skin is warm and dry.  Psychiatric: She has a normal mood and affect. Her behavior is normal. Judgment and thought content normal.    BP 112/73 (BP Location: Right Arm, Patient Position: Sitting, Cuff Size: Normal)   Pulse 84   Temp (!) 97.3 F (36.3 C) (Temporal)   Ht 4\' 8"  (1.422 m)   Wt 123 lb 3.2 oz (55.9 kg)   LMP 12/16/2019 (Approximate) Comment: 1 week  BMI 27.62 kg/m  Wt Readings from Last 3 Encounters:  01/14/20 123 lb 3.2 oz (55.9 kg)  07/01/18 119 lb 9.6 oz (54.3 kg)  05/27/18 116 lb (52.6 kg)     There are no preventive care reminders to display for this patient.  There are no preventive care reminders to display for this patient.  No results found for: TSH Lab Results  Component Value Date   WBC 9.0 05/27/2018   HGB 13.6 05/27/2018   HCT 39.9 05/27/2018   MCV 91 05/27/2018   PLT 227 05/27/2018   Lab Results  Component Value Date   NA 141 05/27/2018   K 4.3 05/27/2018   CO2 20 05/27/2018   GLUCOSE 81 05/27/2018   BUN 10 05/27/2018   CREATININE 0.49 (L) 05/27/2018   BILITOT 0.3 05/27/2018   ALKPHOS 97 05/27/2018   AST 15 05/27/2018   ALT 26 05/27/2018   PROT 7.2 05/27/2018   ALBUMIN 4.6 05/27/2018   CALCIUM 9.0 05/27/2018   No results found for: CHOL No results found for: HDL No results found for: LDLCALC No results found for: TRIG No results found for: CHOLHDL No results found for: 05/29/2018    Assessment & Plan:  Racheal was seen today for back pain.  Diagnoses and all orders for this visit:  Low back pain without sciatica, unspecified back pain laterality, unspecified chronicity Demonstrated proper body mechanic and patient reciprocated in demonstrated, instead of full bucked of water try 1/2 full and not lifting heaving. May alternate with heat and ice application for pain relief. May also alternate with acetaminophen and Ibuprofen as prescribed pain  relief. Other alternatives include massage, acupuncture and water aerobics.  You must stay active and avoid a sedentary lifestyle.  Hospital discharge follow-up Seen in urgent care 2/132021 for left sided chest pain and 12/23/2019 Acute/chronic  left sided thoracic back pain and chest wall pain. Discharge recommended cutting back her schedule to working 5 days a week and increase hydrating with water adequately. .  Pleural effusion In the meantime, will use a short steroid course to address severe chest wall strain as patient states her shob is worse when she moves and  has pain of her ribs   No orders of the defined types were placed in this encounter.   Follow-up: Return if symptoms worsen or fail to improve.    Kerin Perna, NP

## 2020-02-18 ENCOUNTER — Other Ambulatory Visit: Payer: Self-pay

## 2020-02-18 DIAGNOSIS — N6452 Nipple discharge: Secondary | ICD-10-CM

## 2020-03-04 ENCOUNTER — Ambulatory Visit: Payer: Self-pay

## 2020-03-09 ENCOUNTER — Ambulatory Visit: Payer: Self-pay | Admitting: Women's Health

## 2020-03-09 ENCOUNTER — Other Ambulatory Visit: Payer: Self-pay

## 2020-03-09 ENCOUNTER — Ambulatory Visit
Admission: RE | Admit: 2020-03-09 | Discharge: 2020-03-09 | Disposition: A | Discharge status: Home / Self Care | Payer: No Typology Code available for payment source | Source: Ambulatory Visit | Attending: Obstetrics and Gynecology | Admitting: Obstetrics and Gynecology

## 2020-03-09 ENCOUNTER — Encounter: Payer: Self-pay | Admitting: Women's Health

## 2020-03-09 VITALS — BP 110/70 | Temp 98.2°F | Wt 122.0 lb

## 2020-03-09 DIAGNOSIS — N6452 Nipple discharge: Secondary | ICD-10-CM

## 2020-03-09 DIAGNOSIS — Z1239 Encounter for other screening for malignant neoplasm of breast: Secondary | ICD-10-CM

## 2020-03-09 NOTE — Patient Instructions (Signed)
Autoexamen de mamas Breast Self-Awareness Autoexaminarse las mamas significa familiarizarse con el aspecto y la sensacin de las mamas al tacto. Incluye revisarse las mamas habitualmente e informarle al mdico acerca de cualquier cambio. Es importante autoexaminarse las mamas. En ocasiones, los cambios pueden no ser perjudiciales (son benignos), pero a veces un cambio en las mamas puede ser un signo de un problema mdico grave. Es importante aprender a realizar este procedimiento de modo correcto para que pueda detectar los problemas de manera temprana, cuando es ms probable que el tratamiento resulte exitoso. Todas las mujeres deben autoexaminarse las mamas, incluso aquellas que se sometieron a implantes mamarios. Lo que necesita:  Un espejo.  Una habitacin bien iluminada. Cmo realizar el autoexamen de mamas Un autoexamen de mamas es una forma de aprender qu es normal para sus mamas y si sufren modificaciones. Para hacer un autoexamen de las mamas: Busque cambios  1. Qutese toda la ropa por encima de la cintura. 2. Prese frente a un espejo en una habitacin con buena iluminacin. 3. Apoye las manos en las caderas. 4. Empuje con fuerza hacia abajo con las manos. 5. Compare las mamas en el espejo. Busque diferencias entre ellas (asimetra), por ejemplo: ? Diferencias en la forma. ? Diferencias en el tamao. ? Pliegues, depresiones y ndulos en una mama, y no en la otra. 6. Observe cada mama para buscar cambios en la piel, por ejemplo: ? Enrojecimiento. ? Zonas escamosas. 7. Observe si hay cambios en los pezones, por ejemplo: ? Secrecin. ? Sangrado. ? Hoyuelos. ? Enrojecimiento. ? Un cambio en la posicin. Palpe si hay cambios Plpese las mamas con cuidado para detectar ndulos y cambios. Lo mejor es hacerlo mientras est acostada boca arriba en el piso y nuevamente mientras est sentada o de pie en la ducha o la baera con agua jabonosa en la piel. Plpese cada mama de la  siguiente forma: 1. Coloque el brazo del lado de la mama que se examina por arriba de la cabeza. 2. Plpese la mama con la otra mano. 3. Comience en la zona del pezn y haga crculos superpuestos de de pulgada (2cm). Para hacerlo, use las yemas de los tres dedos del medio. Ejerza una presin suave, luego mediana y luego firme. La presin suave le permitir palpar el tejido ms cercano a la piel. La presin mediana le permitir palpar el tejido que est un poco ms profundo. La presin firme le permitir palpar el tejido ms cercano a las costillas. 4. Continuar superponiendo crculos y vaya hacia abajo, hasta sentir las costillas, por debajo del pecho. 5. Desplcese a una distancia del ancho de un dedo hacia el centro del cuerpo. Siga con los crculos superpuestos de de pulgada (2cm) para palpar la mama, mientras asciende lentamente hacia la clavcula. 6. Contine con el examen hacia arriba y hacia abajo con las tres presiones, hasta llegar a la axila.  Anote sus hallazgos Anotar lo que encuentra puede ayudarla a recordar qu debe consultar con el mdico. Escriba los siguientes datos:  Qu es normal para cada mama.  Cualquier cambio que encuentre en cada mama, por ejemplo: ? La clase de cambios que encuentra. ? Dolor o sensibilidad. ? Si hay bultos, su tamao y ubicacin.  En qu momento se encuentra del ciclo menstrual, si usted todava est menstruando. Recomendaciones y consejos generales  Examnese las mamas todos los meses.  Si est amamantando, el mejor momento para examinarse las mamas es despus de amamantar o de usar un sacaleches.  Si menstra,   el mejor momento para examinarse las mamas es 5 a 7das despus del perodo menstrual. Durante el perodo menstrual, las mamas en general tienen ms bultos, y tal vez sea ms difcil percibir los cambios.  Con el tiempo y la prctica, se familiarizar con las variaciones de las mamas y se sentir ms cmoda con el  examen. Comunquese con un mdico si:  Observa un cambio en la forma o el tamao de las mamas o los pezones.  Observa un cambio en la piel de las mamas o los pezones, como la piel enrojecida o escamosa.  Tiene una secrecin anormal proveniente de los pezones.  Encuentra un ndulo o una zona engrosada que no tena antes.  Tiene dolor en las mamas.  Tiene alguna inquietud relacionada con la salud de la mama. Resumen  El autoexamen de mamas incluye buscar cambios fsicos en las mamas, y tambin palpar para detectar cualquier cambio en las mamas.  El autoexamen de mamas debe hacerse frente a un espejo en una habitacin bien iluminada.  Debe examinarse las mamas todos los meses. Si menstra, el mejor momento para examinarse las mamas es de 5 a 7das despus del perodo menstrual.  Informe al mdico si nota cambios en las mamas, como cambios en el tamao, cambios en la piel, dolor o sensibilidad, o un lquido inusual que sale de los pezones. Esta informacin no tiene como fin reemplazar el consejo del mdico. Asegrese de hacerle al mdico cualquier pregunta que tenga. Document Revised: 07/30/2018 Document Reviewed: 07/30/2018 Elsevier Patient Education  2020 Elsevier Inc.  

## 2020-03-09 NOTE — Progress Notes (Addendum)
[name] is a 44 y.o. female who presents to [facility] clinic today with complaint of left breast discharge that is yellow-white in color and on-going since adolescence. Patient endorses a small amount of discharge.   Pap Smear: Pap not smear completed today. Last Pap smear was 06/2018 at [facility] clinic and was normal. Per patient has no history of an abnormal Pap smear. Last Pap smear result is available in Epic.   Physical exam: Breasts Breasts symmetrical. No skin abnormalities bilateral breasts. No nipple retraction right breast. +nipple retraction on left breast, which patient reports has been lifelong. No nipple discharge bilateral breasts. No lymphadenopathy. No lumps palpated bilateral breasts.       Pelvic/Bimanual Pap is not indicated today    Smoking History: Patient has never smoked notreferred to quit line.    Patient Navigation: Patient education provided. Access to services provided for patient through [facility] program. Spanish interpreter provided. No transportation provided   Colorectal Cancer Screening: Per patient has never had colonoscopy completed No complaints today.    Breast and Cervical Cancer Risk Assessment: Patient does not have family history of breast cancer, known genetic mutations, or radiation treatment to the chest before age 14. Patient does not have history of cervical dysplasia, immunocompromised, or DES exposure in-utero.  Risk Assessment    Risk Scores      03/09/2020   Last edited by: Narda Rutherford, LPN   5-year risk: 0.5 %   Lifetime risk: 6.2 %          A: [facility] exam without pap smear Complaint of left breast discharge that is yellow-white in color and on-going since adolescence. Patient endorses a small amount of discharge.  P: Referred patient to [facility] for a diagnostic mammogram. Appointment scheduled 03/09/2020.  Fabian Walder, Odie Sera, NP 03/09/2020 8:59 AM

## 2020-06-01 IMAGING — DX DG CHEST 2V
2 series · 2 of 2 positions shown · non-contrast
Comparison: Chest radiograph 04/24/2018

CLINICAL DATA: Pneumonia follow-up

EXAM:
CHEST - 2 VIEW

[chest pa]
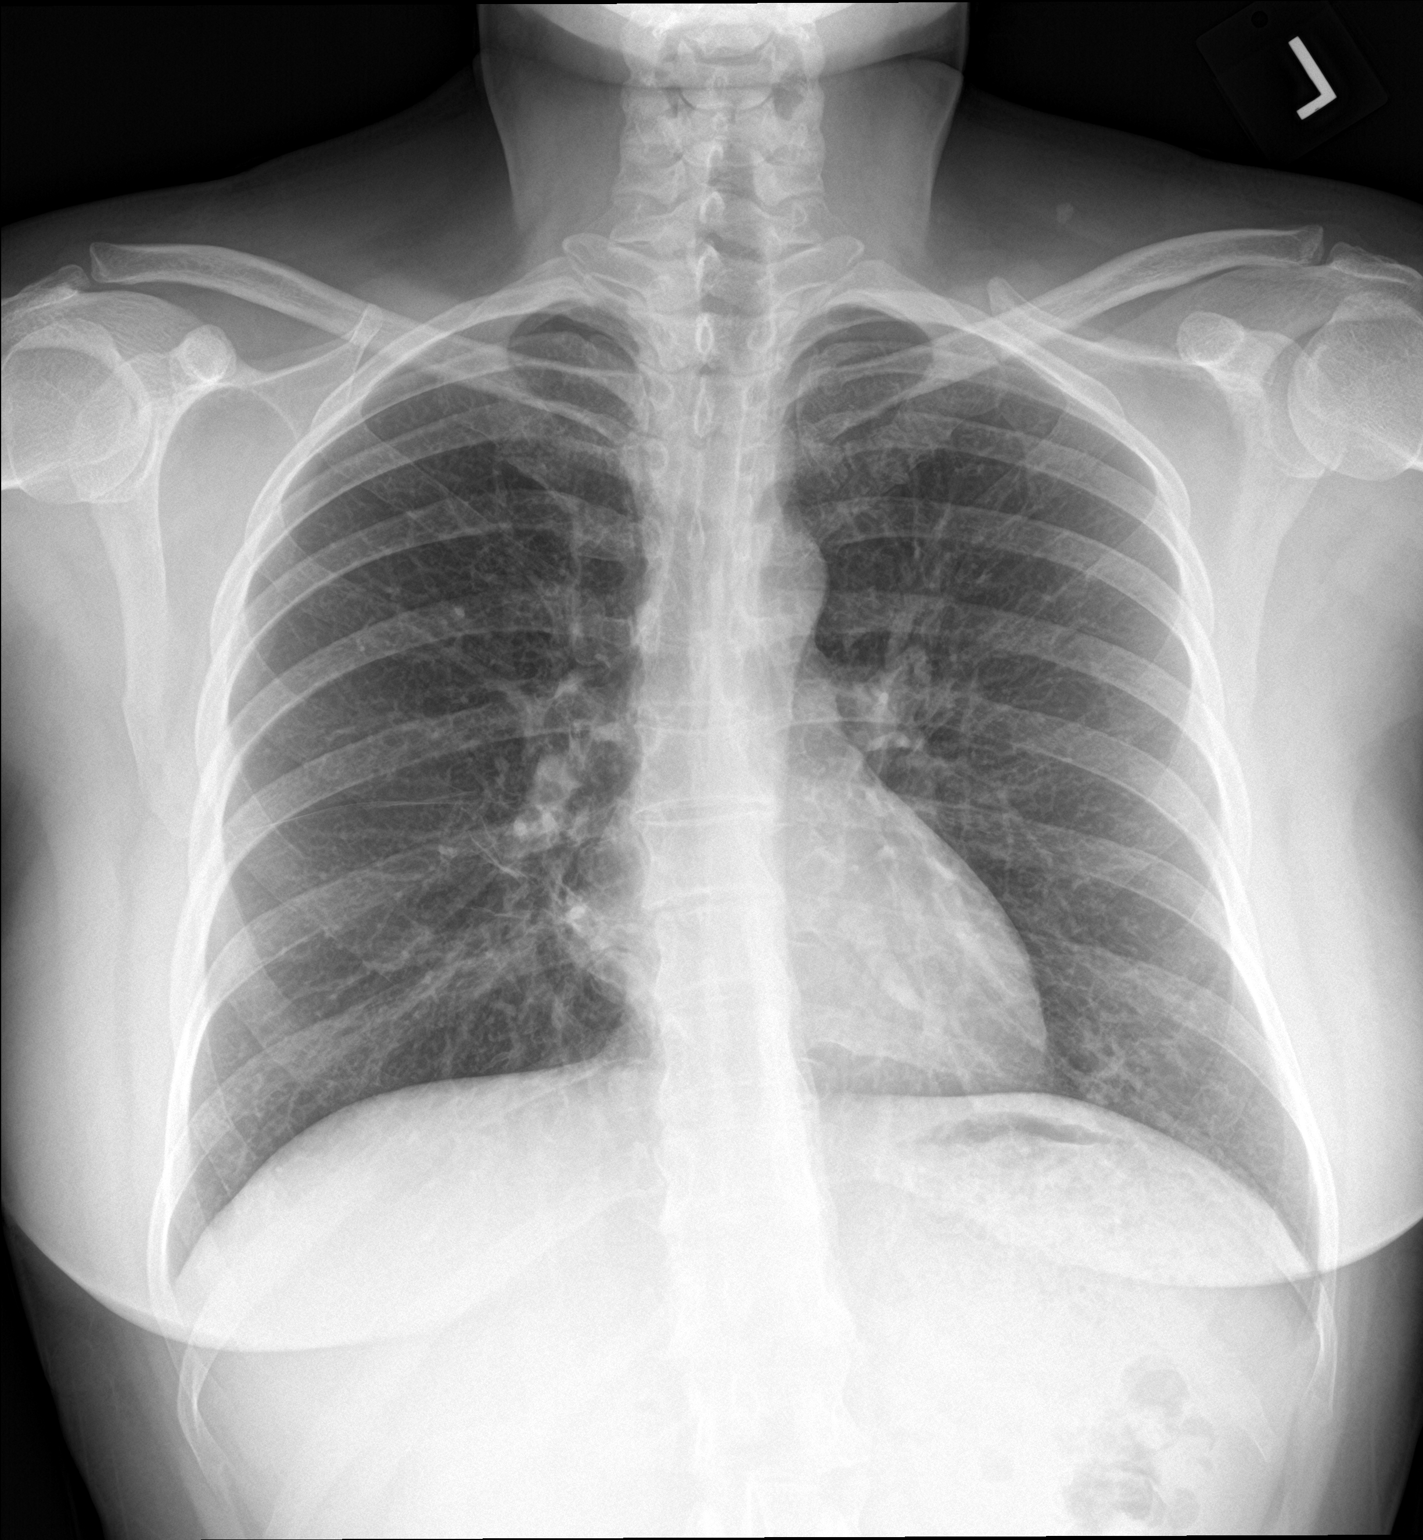

[chest lat]
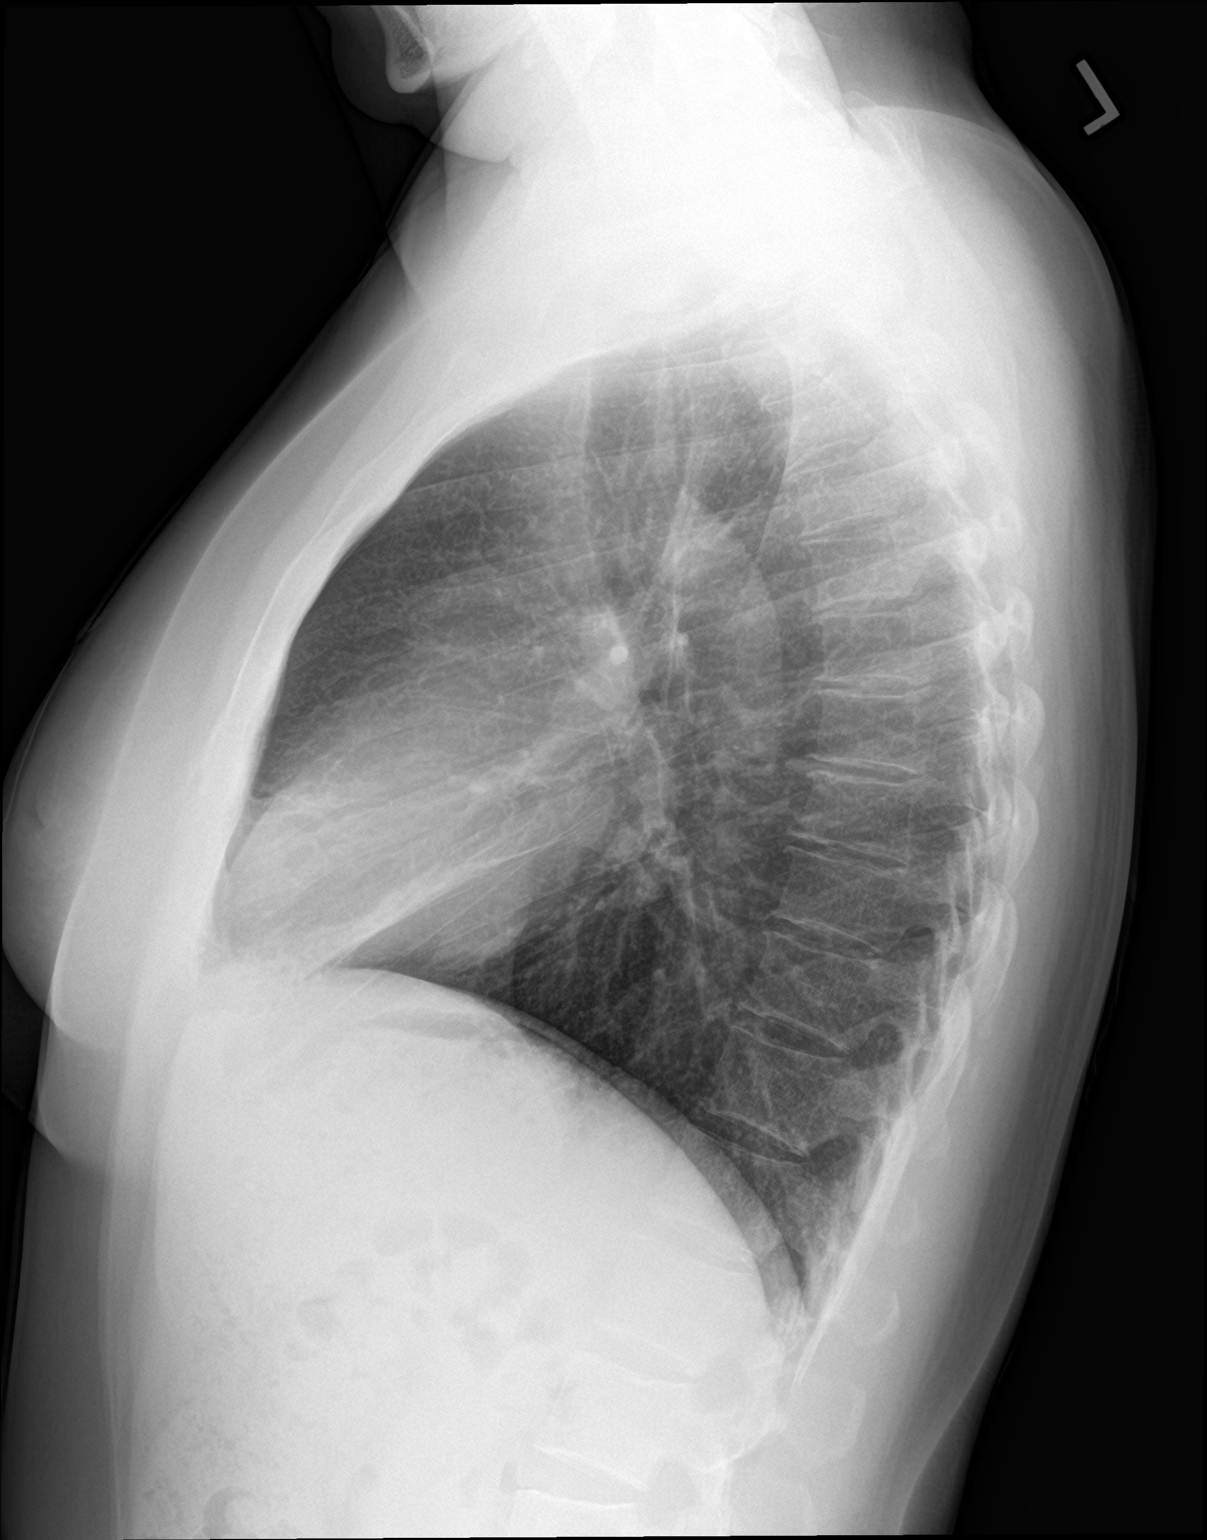

[2 of 2 positions shown; findings below may reference images not displayed]

FINDINGS: The heart size and mediastinal contours are within normal limits.
Right middle lung opacities are unchanged. These are visible only on
the lateral radiograph and may actually be mediastinal. The
visualized skeletal structures are unremarkable.
IMPRESSION: 1. No acute cardiopulmonary disease.
2. Persistence of opacities of the right middle lobe or mediastinum,
visible only on the lateral projection. Chest CT is recommended for
more complete characterization and to exclude neoplasm.

## 2022-03-12 IMAGING — MG DIGITAL DIAGNOSTIC BILAT W/ TOMO W/ CAD
6 of 10 series · 6 of 30 positions shown · non-contrast
Comparison: None.

CLINICAL DATA: 44-year-old with a long-standing intermittent
spontaneous white colored LEFT nipple discharge which the patient
states has not changed since it began when she was a young woman.
She is unsure whether the discharge emanates from a single duct or
multiple ducts. She denies a palpable lump. This is the patient's
initial baseline mammogram.

EXAM:
DIGITAL DIAGNOSTIC BILATERAL MAMMOGRAM WITH CAD AND TOMO
ULTRASOUND LEFT BREAST

[R MLO synth-2D]
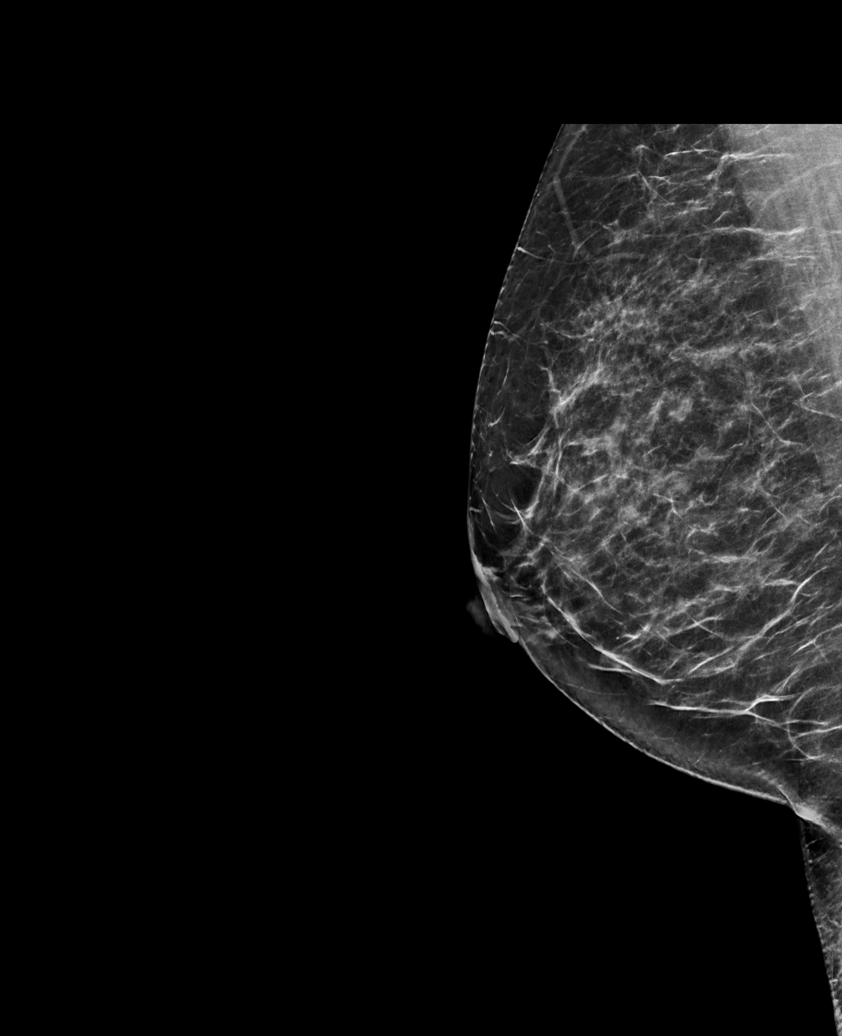

[L MLO synth-2D (1 of 2)]
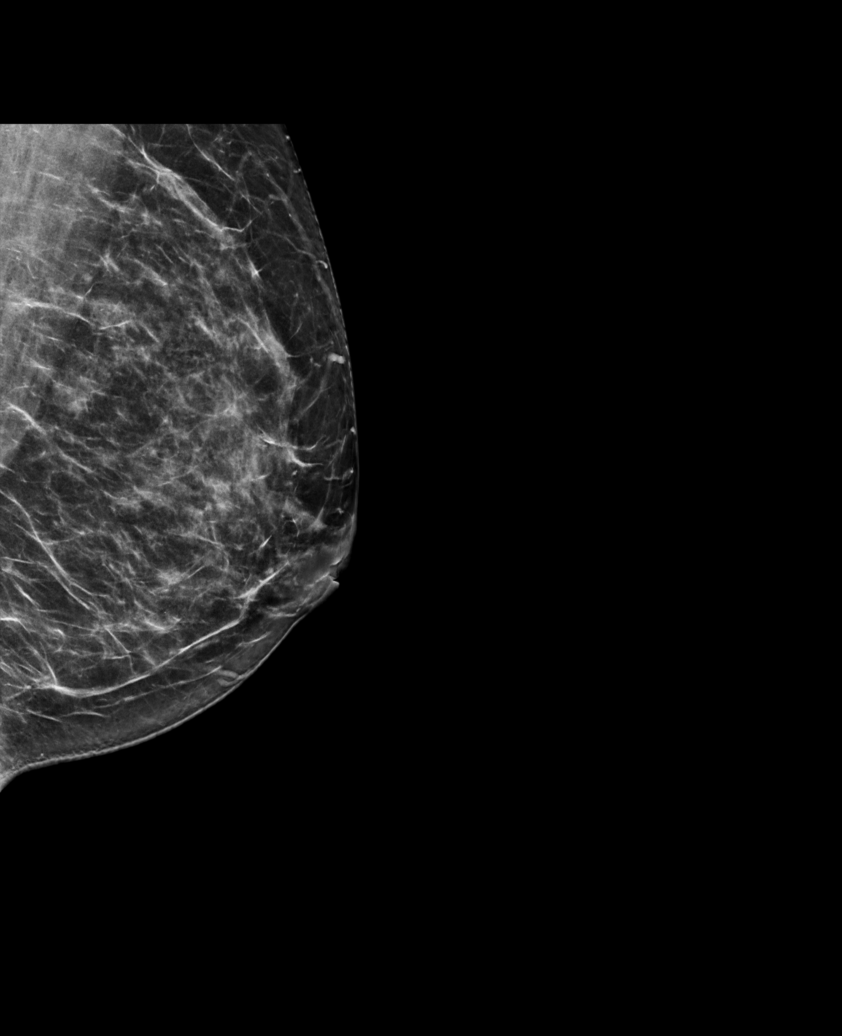

[R CC synth-2D]
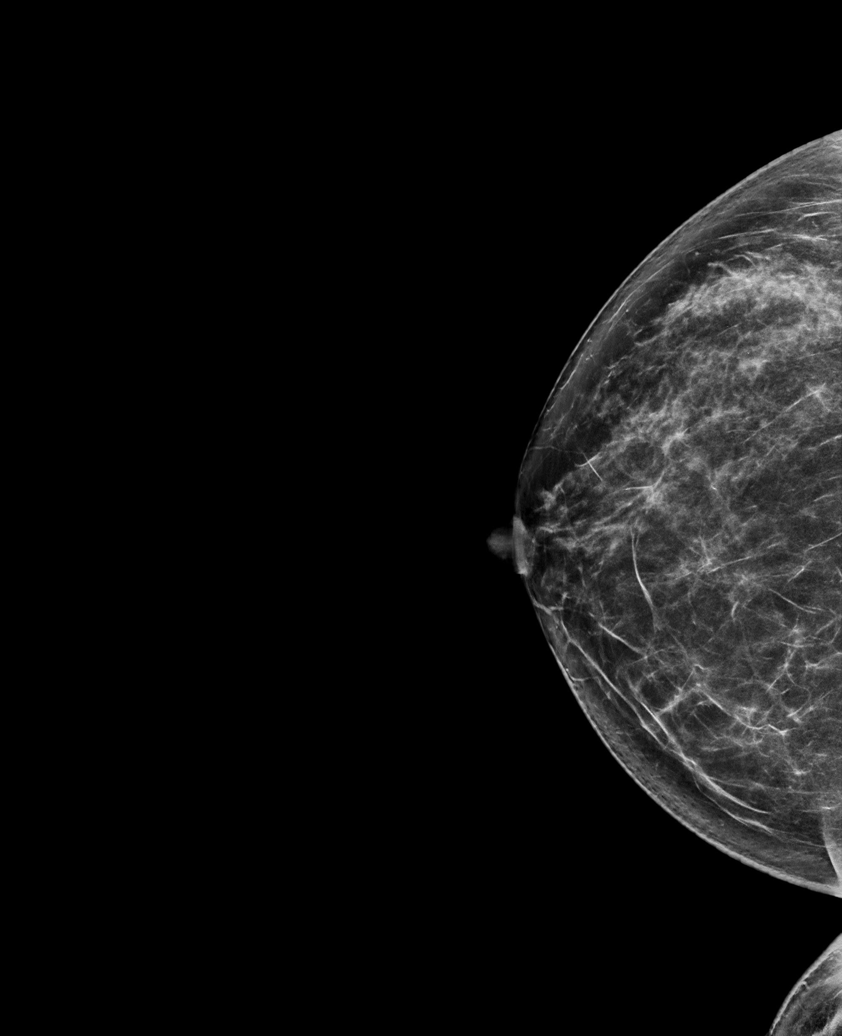

[L MLO synth-2D (2 of 2)]
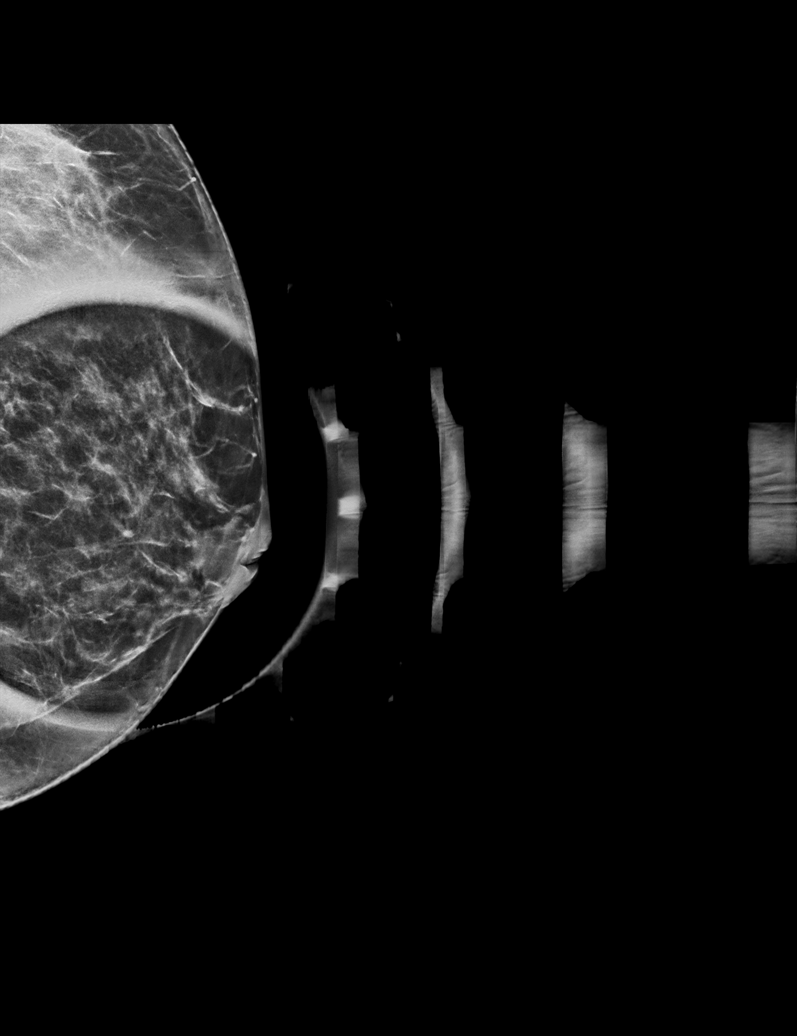

[L CC synth-2D]
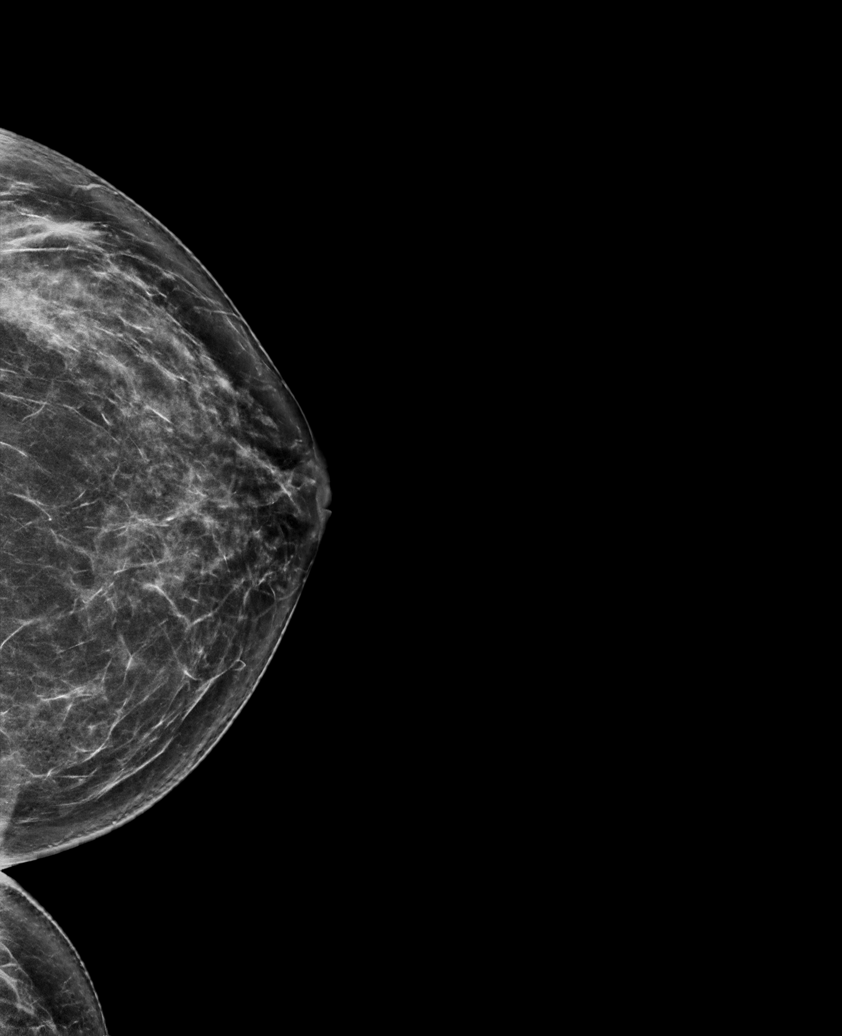

[L CC tomo · tomo slice 38/75.0]
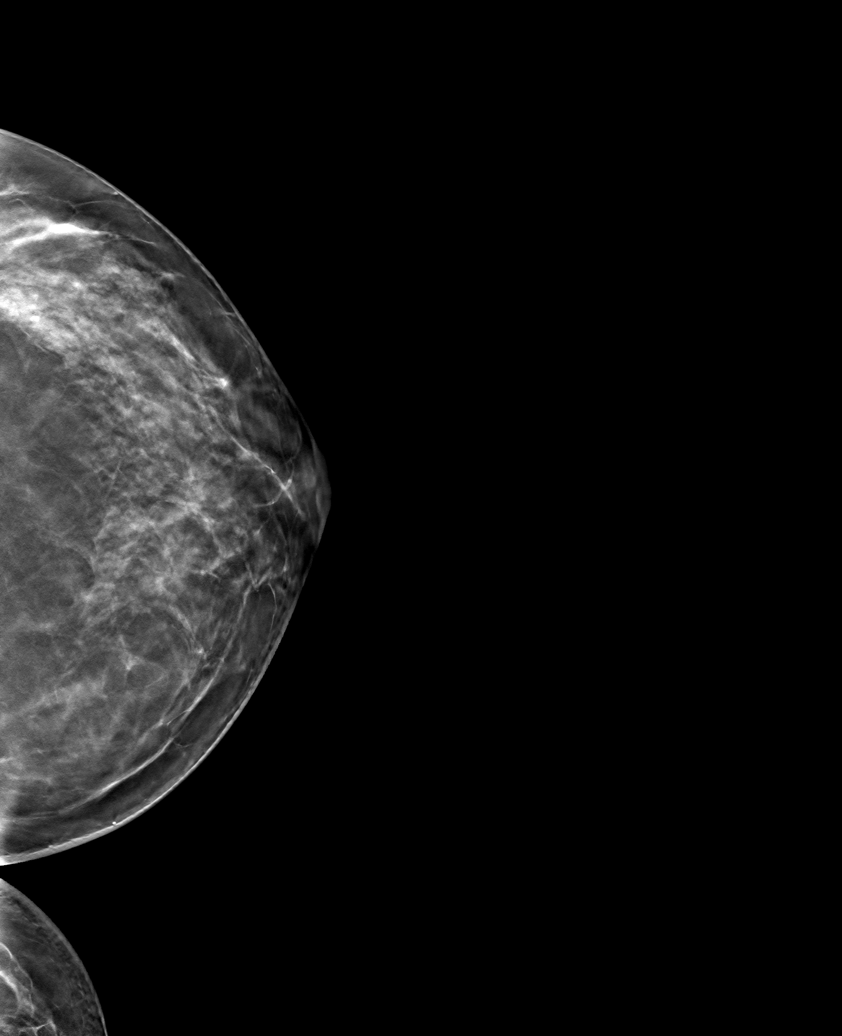

[6 of 30 positions shown; findings below may reference images not displayed]

ACR Breast Density Category c: The breast tissue is heterogeneously
dense, which may obscure small masses.
FINDINGS: Tomosynthesis and synthesized full field CC and MLO views of both
breasts were obtained. Tomosynthesis and synthesized spot
compression MLO view of the subareolar LEFT breast was also
obtained.

No findings suspicious for malignancy in either breast.
Specifically, no mammographic abnormalities in the subareolar LEFT
breast to explain the nipple discharge.

Mammographic images were processed with CAD.

On correlative physical exam, the LEFT nipple is inverted, though
the patient states that it has always been inverted. I was not able
to express the LEFT nipple discharge.

Targeted LEFT breast ultrasound is performed, showing normal dense
fibroglandular tissue in the subareolar location. No evidence of
duct ectasia or intraductal masses.
IMPRESSION: 1. No mammographic or sonographic evidence of malignancy involving
the LEFT breast. No findings to explain the LEFT nipple discharge.
2. No mammographic evidence of malignancy involving the RIGHT
breast.

RECOMMENDATION:
1.  Screening mammogram in one year.(Code:IA-3-KX3)
2. Please see below.

I discussed with the patient the fact that most nipple discharges
are physiologic/benign and a nipple discharge that only occurs when
expressed and/or which arises from multiple duct orifices is
physiologic and requires no follow-up. The fact that her nipple
discharge has not changed in over 20 years also suggests benignity.

If the nipple discharge ever arises from a single duct orifice,
especially if clear or bloody, surgical consultation and a bilateral
breast MRI without and with contrast would be recommended.

I have discussed the findings and recommendations with the patient.
Communication with the patient was achieved with the assistance of a
certified interpreter. If applicable, a reminder letter will be sent
to the patient regarding the next appointment.

BI-RADS CATEGORY  1: Negative.

## 2022-03-12 IMAGING — US US BREAST*L* LIMITED INC AXILLA
1 series · 3 of 3 positions shown · non-contrast
Comparison: None.

CLINICAL DATA: 44-year-old with a long-standing intermittent
spontaneous white colored LEFT nipple discharge which the patient
states has not changed since it began when she was a young woman.
She is unsure whether the discharge emanates from a single duct or
multiple ducts. She denies a palpable lump. This is the patient's
initial baseline mammogram.

EXAM:
DIGITAL DIAGNOSTIC BILATERAL MAMMOGRAM WITH CAD AND TOMO
ULTRASOUND LEFT BREAST

[Series 1: us breast*left* limited inc axilla · 0.06mm/px · 3 of 3 slices shown]
[im 1/3]
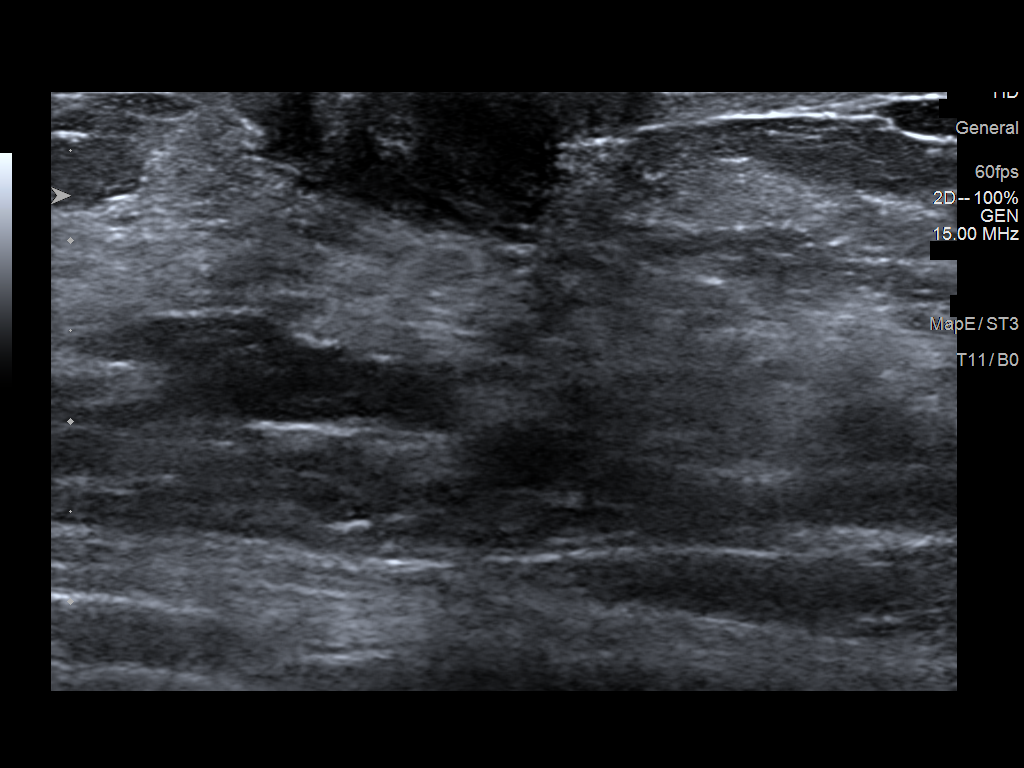
[im 2/3]
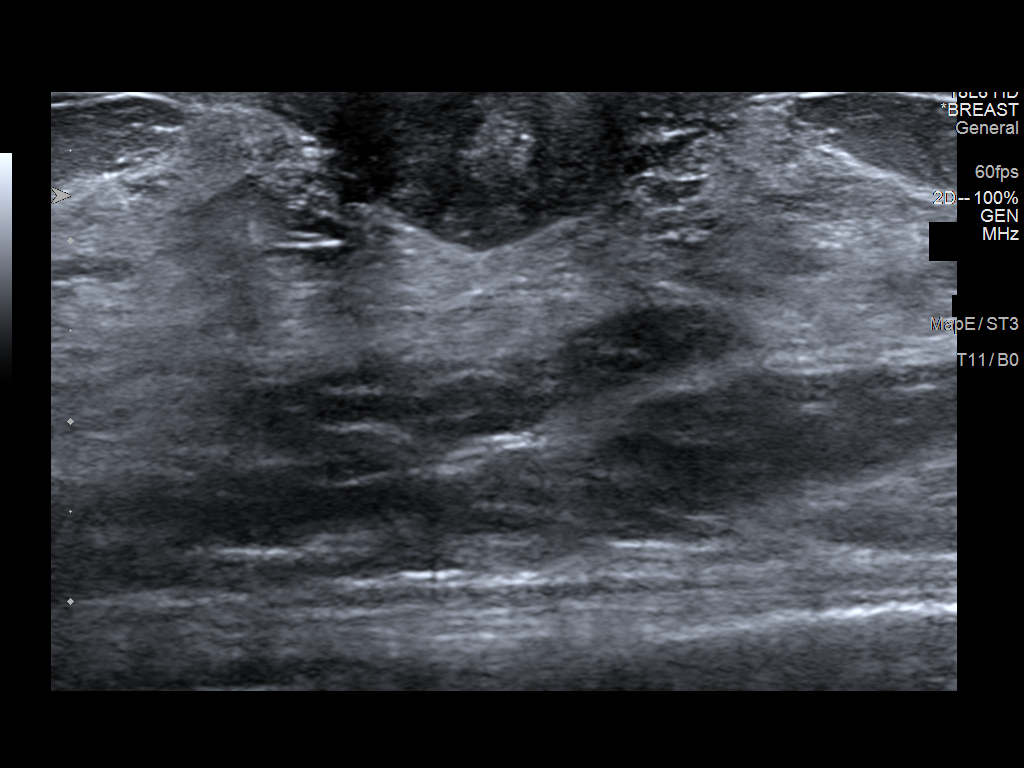
[im 3/3]
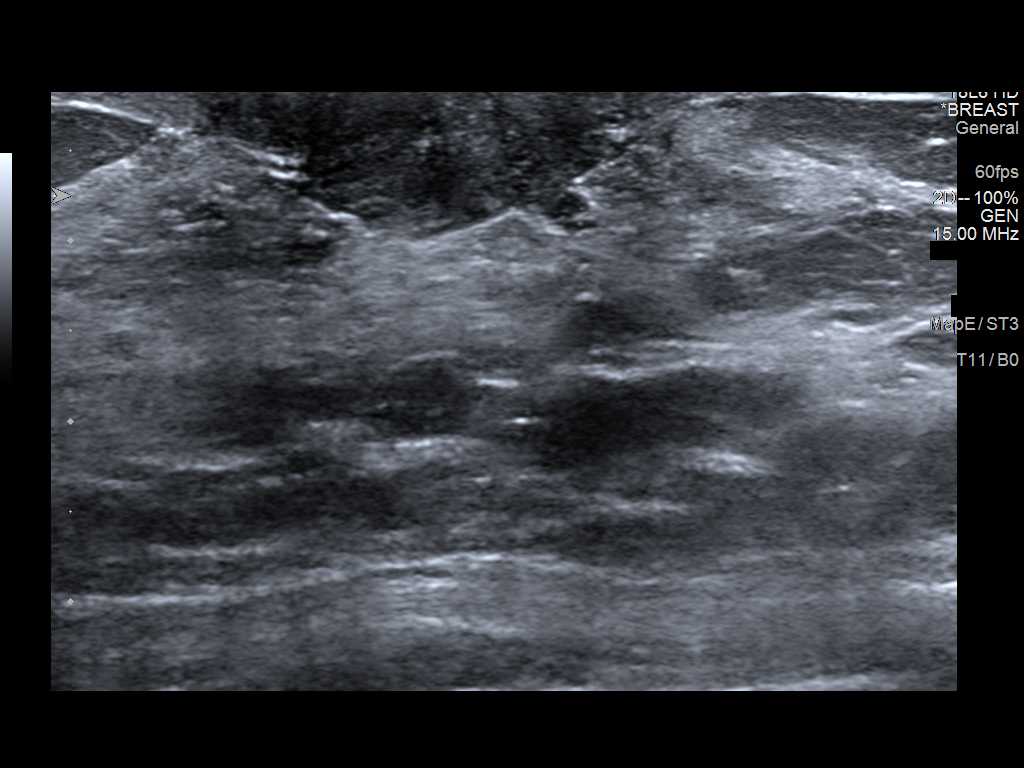

[3 of 3 positions shown; findings below may reference images not displayed]

ACR Breast Density Category c: The breast tissue is heterogeneously
dense, which may obscure small masses.
FINDINGS: Tomosynthesis and synthesized full field CC and MLO views of both
breasts were obtained. Tomosynthesis and synthesized spot
compression MLO view of the subareolar LEFT breast was also
obtained.

No findings suspicious for malignancy in either breast.
Specifically, no mammographic abnormalities in the subareolar LEFT
breast to explain the nipple discharge.

Mammographic images were processed with CAD.

On correlative physical exam, the LEFT nipple is inverted, though
the patient states that it has always been inverted. I was not able
to express the LEFT nipple discharge.

Targeted LEFT breast ultrasound is performed, showing normal dense
fibroglandular tissue in the subareolar location. No evidence of
duct ectasia or intraductal masses.
IMPRESSION: 1. No mammographic or sonographic evidence of malignancy involving
the LEFT breast. No findings to explain the LEFT nipple discharge.
2. No mammographic evidence of malignancy involving the RIGHT
breast.

RECOMMENDATION:
1.  Screening mammogram in one year.(Code:IA-3-KX3)
2. Please see below.

I discussed with the patient the fact that most nipple discharges
are physiologic/benign and a nipple discharge that only occurs when
expressed and/or which arises from multiple duct orifices is
physiologic and requires no follow-up. The fact that her nipple
discharge has not changed in over 20 years also suggests benignity.

If the nipple discharge ever arises from a single duct orifice,
especially if clear or bloody, surgical consultation and a bilateral
breast MRI without and with contrast would be recommended.

I have discussed the findings and recommendations with the patient.
Communication with the patient was achieved with the assistance of a
certified interpreter. If applicable, a reminder letter will be sent
to the patient regarding the next appointment.

BI-RADS CATEGORY  1: Negative.

## 2022-05-03 ENCOUNTER — Inpatient Hospital Stay: Payer: Self-pay | Attending: Obstetrics and Gynecology | Admitting: *Deleted

## 2022-05-03 ENCOUNTER — Other Ambulatory Visit: Payer: Self-pay

## 2022-05-03 VITALS — BP 130/86 | Ht <= 58 in | Wt 119.7 lb

## 2022-05-03 DIAGNOSIS — Z0000 Encounter for general adult medical examination without abnormal findings: Secondary | ICD-10-CM

## 2022-05-03 NOTE — Progress Notes (Signed)
[facility] initial screening   Interpreter- [name], [employer]   Clinical Measurement: There were no vitals filed for this visit. Fasting Labs Drawn Today, will review with patient when they result.   Medical History:  Patient states that she  does not know if she has  high cholesterol, does not have high blood pressure and she does not have diabetes.  Medications:  Patient states that she does not take medication to lower cholesterol, blood pressure or blood sugar.  Patient does not take an aspirin a day to help prevent a heart attack or stroke.    Blood pressure, self measurement: Patient states that she does not measure blood pressure from home. She checks her blood pressure N/A. She shares her readings with a health care provider: N/A.   Nutrition: Patient states that on average she eats 1 cups of fruit and 1 cups of vegetables per day. Patient states that she does not eat fish at least 2 times per week. Patient eats less than half servings of whole grains. Patient drinks less than 36 ounces of beverages with added sugar weekly: yes. Patient is currently watching sodium or salt intake: yes. In the past 7 days patient has consumed drinks containing alcohol on 0 days. On a day that patient consumes drinks containing alcohol on average 0 drinks are consumed.      Physical activity:  Patient states that she gets 60 minutes of moderate and 0 minutes of vigorous physical activity each week.  Smoking status:  Patient states that she has has never smoked .   Quality of life:  Over the past 2 weeks patient states that she had little interest or pleasure in doing things: not at all. She has been feeling down, depressed or hopeless:not at all.    Risk reduction and counseling:   Health Coaching: Spoke with patient about the daily recommendation for fruits and vegetables (2 cups of fruit and 3 cups of vegetables). Patient does not consume fish regularly. Gave suggestions for heart healthy fish that  she can try incorporating into diet (salmon, tuna, mackerel, sardines, sea bass or trout). Patient does not consume whole grains regularly. Patient does consume oatmeal sometimes.  Gave suggestions for other whole grains that patient can try incorporating into diet (whole grain cereals, whole wheat pasta, brown rice or whole wheat bread). Patient does not consume beverages with added sugars. Patient was concerned that blood pressure was higher than normal. Spoke with patient about watching the amount of sodium that she consumes as well as daily exercise to help with lowering blood pressure. Patient would like to work on increasing her exercise.   Goal: Patient would like to increase exercise. Patient would like to try and exercise 3 days a week for 30 minutes. Patient will work on reaching this goal over the next 3 months.    Navigation:  I will notify patient of lab results.  Patient is aware of 2 more health coaching sessions and a follow up.  Time: 30 minutes

## 2022-05-04 LAB — LIPID PANEL
Cholesterol, Total: 179 mg/dL (ref 100–199)
LDL Chol Calc (NIH): 106 mg/dL — ABNORMAL HIGH (ref 0–99)
Triglycerides: 137 mg/dL (ref 0–149)
VLDL Cholesterol Cal: 24 mg/dL (ref 5–40)

## 2022-05-04 LAB — HEMOGLOBIN A1C
Est. average glucose Bld gHb Est-mCnc: 114 mg/dL
Hgb A1c MFr Bld: 5.6 % (ref 4.8–5.6)

## 2022-05-04 LAB — GLUCOSE, RANDOM: Glucose: 97 mg/dL (ref 70–99)

## 2022-05-08 ENCOUNTER — Telehealth: Payer: Self-pay

## 2022-05-17 ENCOUNTER — Encounter: Payer: Self-pay | Admitting: Physician Assistant

## 2022-05-17 ENCOUNTER — Ambulatory Visit: Payer: Self-pay | Admitting: Physician Assistant

## 2022-05-17 VITALS — BP 121/75 | HR 75 | Resp 18 | Ht <= 58 in | Wt 122.0 lb

## 2022-05-17 DIAGNOSIS — R1032 Left lower quadrant pain: Secondary | ICD-10-CM

## 2022-05-17 DIAGNOSIS — Z8619 Personal history of other infectious and parasitic diseases: Secondary | ICD-10-CM

## 2022-05-17 DIAGNOSIS — K2950 Unspecified chronic gastritis without bleeding: Secondary | ICD-10-CM

## 2022-05-17 DIAGNOSIS — R1013 Epigastric pain: Secondary | ICD-10-CM

## 2022-05-17 MED ORDER — PANTOPRAZOLE SODIUM 40 MG PO TBEC: 40.0000 mg | DELAYED_RELEASE_TABLET | Freq: Every day | ORAL | 3 refills | Status: DC

## 2022-05-17 NOTE — Patient Instructions (Signed)
You are going to start taking Protonix on a daily basis.  We will call you with your lab results when they are available and.  Roney Jaffe, PA-C Physician Assistant [facility] [email or web address]   Dolor abdominal en los adultos Abdominal Pain, Adult El dolor en el abdomen (dolor abdominal) puede tener muchas causas. A menudo, el dolor abdominal no es grave y [place] sin tratamiento o con tratamiento en la casa. Sin embargo, a Facilities manager abdominal es grave. El mdico le har preguntas sobre sus antecedentes mdicos y le har un examen fsico para tratar de Production assistant, radio causa del dolor abdominal. Siga estas instrucciones en su casa:  Medicamentos Use los medicamentos de venta libre y los recetados solamente como se lo haya indicado el mdico. No tome un laxante a menos que se lo haya indicado el mdico. Instrucciones generales Controle su afeccin para detectar cualquier cambio. Beba suficiente lquido como para Pharmacologist la orina de color amarillo plido. Concurra a todas las visitas de [address] se lo haya indicado el mdico. Esto es importante. Comunquese con un mdico si: El dolor abdominal cambia o empeora. No tiene apetito o baja de peso sin proponrselo. Est estreido o tiene diarrea durante ms de 2 o 3 das. Tiene dolor cuando orina o defeca. El dolor abdominal lo despierta de noche. El dolor empeora con las comidas, despus de comer o con determinados alimentos. Tiene vmitos y no puede retener nada de lo que ingiere. Tiene fiebre. Observa sangre en la orina. Solicite ayuda inmediatamente si: El dolor no desaparece tan pronto como el mdico le dijo que era esperable. No puede dejar de vomitar. El Engineer, mining se siente solo en zonas del abdomen, como el lado derecho o la parte inferior izquierda del abdomen. Si se localiza en la zona derecha, posiblemente podra tratarse de apendicitis. Las heces son  sanguinolentas o de color negro, o de aspecto alquitranado. Tiene dolor intenso, clicos o distensin abdominal. Tiene signos de deshidratacin, como los siguientes: Orina de color oscuro, muy escasa o falta de orina. Labios agrietados. Sequedad de boca. Ojos hundidos. Somnolencia. Debilidad. Tiene dificultad para respirar o Journalist, newspaper. Resumen A menudo, el dolor abdominal no es grave y [place] sin tratamiento o con tratamiento en la casa. Sin embargo, a Facilities manager abdominal es grave. Controle su afeccin para Insurance risk surveyor cambio. Use los medicamentos de venta libre y los recetados solamente como se lo haya indicado el mdico. Comunquese con un mdico si el dolor abdominal cambia o [place]. Busque ayuda de inmediato si tiene dolor intenso, clicos o distensin abdominal. Esta informacin no tiene Theme park manager el consejo del mdico. Asegrese de hacerle al mdico cualquier pregunta que tenga. Document Revised: 05/07/2019 Document Reviewed: 05/07/2019 Elsevier Patient Education  2023 [employer].

## 2022-05-17 NOTE — Progress Notes (Signed)
Established Patient Office Visit  Subjective   Patient ID: Kara Hudson, female    DOB: [birth year]  Age: 46 y.o. MRN: [id number]  Chief Complaint  Patient presents with   Abdominal Pain    Lower abdomen under belly button. Left sided pain with lump.    States that she has been having abdominal pain for the past year.  Describes it as epigastric, as well as lower left side and right around her umbilical area.  Describes the pain as constant, feels like a toothache.  States the pain is generally worse at night and in the morning when she first wakes up.  Endorses several episodes of heartburn throughout the week.  States she has not tried anything for relief.  Denies any changes in her bowel movements, denies constipation or diarrhea.  States that her menses are regular without concern.  On review of medical records, patient did test positive for H. pylori in 2019.  Patient states that she did not know of this diagnosis, did not ever take medications for this.    Due to language barrier, an interpreter was present during the history-taking and subsequent discussion (and for part of the physical exam) with this patient.     Past Medical History:  Diagnosis Date   Medical history non-contributory    Social History   Socioeconomic History   Marital status: Legally Separated    Spouse name: Not on file   Number of children: 4   Years of education: Not on file   Highest education level: 12th grade  Occupational History   Not on file  Tobacco Use   Smoking status: Never   Smokeless tobacco: Never  Vaping Use   Vaping Use: Never used  Substance and Sexual Activity   Alcohol use: No   Drug use: No   Sexual activity: Yes    Partners: Male    Birth control/protection: Surgical  Other Topics Concern   Not on file  Social History Narrative   Not on file   Social Determinants of Health   Financial Resource Strain: Not on file  Food Insecurity: No Food Insecurity  (05/03/2022)   Hunger Vital Sign    Worried About Running Out of Food in the Last Year: Never true    Ran Out of Food in the Last Year: Never true  Transportation Needs: No Transportation Needs (05/03/2022)   PRAPARE - Administrator, Civil Service (Medical): No    Lack of Transportation (Non-Medical): No  Physical Activity: Not on file  Stress: Not on file  Social Connections: Not on file  Intimate Partner Violence: Not on file   Family History  Problem Relation Age of Onset   Hypertension Mother    Alcohol abuse Mother    Cirrhosis Mother    Hypertension Son    No Known Allergies  Review of Systems  Constitutional:  Negative for chills and fever.  HENT: Negative.    Eyes: Negative.   Respiratory:  Negative for shortness of breath.   Cardiovascular:  Negative for chest pain.  Gastrointestinal:  Positive for abdominal pain and heartburn. Negative for constipation, diarrhea, nausea and vomiting.  Genitourinary: Negative.   Musculoskeletal: Negative.   Skin: Negative.   Neurological: Negative.   Endo/Heme/Allergies: Negative.   Psychiatric/Behavioral: Negative.        Objective:     BP 121/75 (BP Location: Left Arm, Patient Position: Sitting, Cuff Size: Normal)   Pulse 75   Resp 18   Ht  4\' 9"  (1.448 m)   Wt 122 lb (55.3 kg)   LMP 04/28/2022 (Exact Date)   SpO2 99%   BMI 26.40 kg/m    Physical Exam Vitals and nursing note reviewed.  Constitutional:      Appearance: Normal appearance. She is well-developed.  HENT:     Head: Normocephalic and atraumatic.     Right Ear: External ear normal.     Left Ear: External ear normal.     Nose: Nose normal.     Mouth/Throat:     Mouth: Mucous membranes are moist.     Pharynx: Oropharynx is clear.  Eyes:     Extraocular Movements: Extraocular movements intact.     Pupils: Pupils are equal, round, and reactive to light.  Cardiovascular:     Rate and Rhythm: Normal rate.     Pulses: Normal pulses.     Heart  sounds: Normal heart sounds.  Pulmonary:     Effort: Pulmonary effort is normal.     Breath sounds: Normal breath sounds.  Abdominal:     General: Abdomen is flat. Bowel sounds are normal.     Palpations: Abdomen is soft.     Tenderness: There is abdominal tenderness in the epigastric area, periumbilical area and left lower quadrant. There is no guarding. Negative signs include Murphy's sign.     Hernia: There is no hernia in the umbilical area.  Musculoskeletal:        General: Normal range of motion.     Cervical back: Normal range of motion and neck supple.  Skin:    General: Skin is warm and dry.  Neurological:     General: No focal deficit present.     Mental Status: She is alert and oriented to person, place, and time.  Psychiatric:        Mood and Affect: Mood normal.        Behavior: Behavior normal.        Thought Content: Thought content normal.        Judgment: Judgment normal.       Assessment & Plan:   Problem List Items Addressed This Visit       Digestive   Chronic gastritis without bleeding - Primary   Relevant Medications   pantoprazole (PROTONIX) 40 MG tablet   Other Relevant Orders   H Pylori, IGM, IGG, IGA AB   Comp. Metabolic Panel (12)   CBC with Differential/Platelet   Other Visit Diagnoses     Abdominal pain, epigastric       LLQ abdominal pain       History of Helicobacter pylori infection          1. Chronic gastritis without bleeding, unspecified gastritis type Patient previously tested positive for H. pylori in 2019, but states that she did not have any treatment for this.  Will rule out H. pylori prior to considering imaging.  Trial of Protonix.  Patient education given on supportive care.  Red flags given for prompt reevaluation. - pantoprazole (PROTONIX) 40 MG tablet; Take 1 tablet (40 mg total) by mouth daily.  Dispense: 30 tablet; Refill: 3 - H Pylori, IGM, IGG, IGA AB - Comp. Metabolic Panel (12) - CBC with  Differential/Platelet  2. Abdominal pain, epigastric   3. LLQ abdominal pain   4. History of Helicobacter pylori infection    I have reviewed the patient's medical history (PMH, PSH, Social History, Family History, Medications, and allergies) , and have been updated if relevant. I  spent 30 minutes reviewing chart and  face to face time with patient.    Return if symptoms worsen or fail to improve.    Kasandra Knudsen Mayers, PA-C

## 2022-05-22 ENCOUNTER — Encounter: Payer: No Typology Code available for payment source | Admitting: Student

## 2022-06-11 LAB — COMP. METABOLIC PANEL (12)
AST: 17 IU/L (ref 0–40)
Albumin/Globulin Ratio: 2 (ref 1.2–2.2)
Albumin: 4.3 g/dL (ref 3.8–4.8)
Alkaline Phosphatase: 99 IU/L (ref 44–121)
BUN/Creatinine Ratio: 17 (ref 9–23)
BUN: 8 mg/dL (ref 6–24)
Bilirubin Total: <0.2 mg/dL (ref 0.0–1.2)
Calcium: 9.1 mg/dL (ref 8.7–10.2)
Chloride: 107 mmol/L — ABNORMAL HIGH (ref 96–106)
Creatinine, Ser: 0.48 mg/dL — ABNORMAL LOW (ref 0.57–1.00)
Globulin, Total: 2.2 g/dL (ref 1.5–4.5)
Glucose: 104 mg/dL — ABNORMAL HIGH (ref 70–99)
Potassium: 3.6 mmol/L (ref 3.5–5.2)
Sodium: 140 mmol/L (ref 134–144)
Total Protein: 6.5 g/dL (ref 6.0–8.5)
eGFR: 118 mL/min/{1.73_m2} (ref >59)

## 2022-06-11 LAB — CBC WITH DIFFERENTIAL/PLATELET
Basophils Absolute: 0.1 10*3/uL (ref 0.0–0.2)
Basos: 1 %
EOS (ABSOLUTE): 0.2 10*3/uL (ref 0.0–0.4)
Eos: 2 %
Hematocrit: 38.4 % (ref 34.0–46.6)
Hemoglobin: 12.7 g/dL (ref 11.1–15.9)
Immature Grans (Abs): 0 10*3/uL (ref 0.0–0.1)
Immature Granulocytes: 0 %
Lymphocytes Absolute: 2.7 10*3/uL (ref 0.7–3.1)
Lymphs: 39 %
MCH: 29.7 pg (ref 26.6–33.0)
MCHC: 33.1 g/dL (ref 31.5–35.7)
MCV: 90 fL (ref 79–97)
Monocytes Absolute: 0.6 10*3/uL (ref 0.1–0.9)
Monocytes: 9 %
Neutrophils Absolute: 3.5 10*3/uL (ref 1.4–7.0)
Neutrophils: 49 %
Platelets: 249 10*3/uL (ref 150–450)
RBC: 4.28 x10E6/uL (ref 3.77–5.28)
RDW: 13.7 % (ref 11.7–15.4)
WBC: 7.1 10*3/uL (ref 3.4–10.8)

## 2022-06-11 LAB — H PYLORI, IGM, IGG, IGA AB
H pylori, IgM Abs: <9 units (ref 0.0–8.9)
H. pylori, IgA Abs: <9 units (ref 0.0–8.9)
H. pylori, IgG AbS: 0.76 Index Value (ref 0.00–0.79)

## 2022-06-15 ENCOUNTER — Other Ambulatory Visit: Payer: Self-pay | Admitting: Physician Assistant

## 2022-06-15 DIAGNOSIS — R1032 Left lower quadrant pain: Secondary | ICD-10-CM

## 2022-06-15 DIAGNOSIS — R1013 Epigastric pain: Secondary | ICD-10-CM

## 2022-07-25 ENCOUNTER — Other Ambulatory Visit: Payer: Self-pay | Admitting: Hematology and Oncology

## 2022-07-25 DIAGNOSIS — Z124 Encounter for screening for malignant neoplasm of cervix: Secondary | ICD-10-CM

## 2022-07-25 NOTE — Addendum Note (Signed)
Addended by: Meryl Dare on: 07/25/2022 09:31 AM   Modules accepted: Orders

## 2022-07-25 NOTE — Progress Notes (Signed)
Patient: Kara Hudson           Date of Birth: 1976/03/09           MRN: [id number] Visit Date: 07/25/2022 PCP: Patient, No Pcp Per  Cervical Cancer Screening Do you smoke?: No Have you ever had or been told you have an allergy to latex products?: No Marital status: Married Date of last pap smear: 2-5 yrs ago (07/01/18) Date of last menstrual period: 07/17/22 Number of pregnancies: 4 Number of births: 4 Have you ever had any of the following? Hysterectomy: No Tubal ligation (tubes tied): Yes Abnormal bleeding: No Abnormal pap smear: No Venereal warts: No A sex partner with venereal warts: No A high risk* sex partner: No  Cervical Exam  Abnormal Observations: Benign exam Recommendations: 06/22/2018 Pap-Negative (+yeast infection/ [facility]), 07/29/2013 Pap/ HPV- negative ([facility]/ Dr. Macon Large)  Repeat pap in 5 years     Translator Kara Hudson present for this exam. Patient's History Patient Active Problem List   Diagnosis Date Noted   History of bilateral tubal ligation 05/27/2018   Chronic gastritis without bleeding 05/27/2018   Gastroesophageal reflux disease 05/27/2018   Pneumonia of right middle lobe due to infectious organism 05/27/2018   Chronic left-sided thoracic back pain 05/27/2018   Language barrier, speaks Spanish only 07/29/2013   Past Medical History:  Diagnosis Date   Medical history non-contributory     Family History  Problem Relation Age of Onset   Hypertension Mother    Alcohol abuse Mother    Cirrhosis Mother    Hypertension Son     Social History   Occupational History   Not on file  Tobacco Use   Smoking status: Never   Smokeless tobacco: Never  Vaping Use   Vaping Use: Never used  Substance and Sexual Activity   Alcohol use: No   Drug use: No   Sexual activity: Yes    Partners: Male    Birth control/protection: Surgical

## 2022-07-28 LAB — CYTOLOGY - PAP
Comment: NEGATIVE
Diagnosis: NEGATIVE
High risk HPV: NEGATIVE

## 2022-07-31 ENCOUNTER — Telehealth: Payer: Self-pay

## 2022-07-31 NOTE — Telephone Encounter (Signed)
Called patient via Kara Hudson, [employer] to give pap smear results. Informed patient that pap smear was normal and HPV was negative. Based on this result her next pap smear will be due in 5 years. Patient voiced understanding.  

## 2022-08-07 ENCOUNTER — Telehealth: Payer: Self-pay

## 2022-08-07 NOTE — Telephone Encounter (Signed)
Health Coaching 3  interpreter- Kara Hudson, [facility]   Goals- Patient has reduced the amount of tortillas and rice that she consumes. Patient has not been cooking fried foods recently. Patient has been walking 3-4 days per week for 20 minutes.   New goal- Increase walking to 5 days a week for 20 minutes. Decrease the amount of red meats consumed. Increase lean protein intake with chicken and fish.   Barrier to reaching goal-    Strategies to overcome-    Navigation:  Patient is aware of  a follow up session on 09/25/22 @ 2:30 pm.   Time-  10 minutes

## 2022-08-15 ENCOUNTER — Ambulatory Visit (HOSPITAL_COMMUNITY)
Admission: RE | Admit: 2022-08-15 | Discharge: 2022-08-15 | Disposition: A | Discharge status: Home / Self Care | Payer: Self-pay | Source: Ambulatory Visit | Attending: Physician Assistant | Admitting: Physician Assistant

## 2022-08-15 DIAGNOSIS — R1032 Left lower quadrant pain: Secondary | ICD-10-CM | POA: Insufficient documentation

## 2022-08-15 DIAGNOSIS — R1013 Epigastric pain: Secondary | ICD-10-CM | POA: Insufficient documentation

## 2022-08-17 ENCOUNTER — Telehealth: Payer: Self-pay

## 2022-08-17 ENCOUNTER — Other Ambulatory Visit: Payer: Self-pay | Admitting: Physician Assistant

## 2022-08-17 DIAGNOSIS — R1032 Left lower quadrant pain: Secondary | ICD-10-CM

## 2022-08-17 NOTE — Telephone Encounter (Signed)
Interpretor ID: [id number], [name] was given her recent US results. Patient feel her pain is localized near her left ovary and has requested for a referral to GYN. Message sen to Physician Assistant

## 2022-09-25 ENCOUNTER — Ambulatory Visit: Payer: No Typology Code available for payment source

## 2022-10-30 ENCOUNTER — Encounter: Payer: No Typology Code available for payment source | Admitting: Family Medicine

## 2022-10-30 ENCOUNTER — Inpatient Hospital Stay: Payer: Self-pay | Attending: Obstetrics and Gynecology | Admitting: *Deleted

## 2022-10-30 VITALS — BP 110/70 | Ht <= 58 in | Wt 117.8 lb

## 2022-10-30 DIAGNOSIS — Z0000 Encounter for general adult medical examination without abnormal findings: Secondary | ICD-10-CM

## 2022-10-30 NOTE — Progress Notes (Signed)
[facility] follow up   Interpreter: [name], Haroldine Laws  Clinical Measurement:   Vitals:   10/30/22 1307  BP: 110/68      Medical History:  Patient states that she  does not know if she has  high cholesterol, does not have high blood pressure and she does not have diabetes.  Medications:  Patient states that she does not take medication to lower cholesterol, blood pressure and blood sugar.  Patient does not take an aspirin a day to help prevent a heart attack or stroke.    Blood pressure, self measurement: Patient states that she does not measure blood pressure from home. She checks her blood pressure N/A. She shares her readings with a health care provider: N/A.   Nutrition: Patient states that on average she eats 1 cups of fruit and 0 cups of vegetables per day. Patient states that she does not eat fish at least 2 times per week. Patient eats less than half servings of whole grains. Patient drinks less than 36 ounces of beverages with added sugar weekly: yes. Patient is currently watching sodium or salt intake: no. In the past 7 days patient has had 0 drinks containing alcohol. On average patient drinks 0 drinks containing alcohol per day.      Physical activity:  Patient states that she gets 0 minutes of moderate and 0 minutes of vigorous physical activity each week.  Smoking status:  Patient states that she has has never smoked .   Quality of life:  Over the past 2 weeks patient states that she had little interest or pleasure in doing things: not at all. She has been feeling down, depressed or hopeless:not at all.    Risk reduction and counseling:   Health Coaching: Spoke with patient about heart healthy diet (reduce fried and fatty foods, reduce red meat consumption, increase lean protein like chicken and fish, increase fresh fruits and vegetable daily intake). Spoke with patient about watching the amount of beverages with added sugars consumed. Patient consumes on averages 1-3 mini  cans of coke each week. Explained that the goal is for less than 36 ounces each week. Patient has not been exercising. Encouraged patient to try and get at least 20-30 minutes daily.    Navigation: This was the  follow up session for this patient, I will check up on her progress in the coming months.  Time: 20 minutes

## 2023-04-03 ENCOUNTER — Ambulatory Visit (HOSPITAL_COMMUNITY)
Admission: EM | Admit: 2023-04-03 | Discharge: 2023-04-03 | Disposition: A | Discharge status: Home / Self Care | Payer: No Typology Code available for payment source | Attending: Emergency Medicine | Admitting: Emergency Medicine

## 2023-04-03 ENCOUNTER — Encounter (HOSPITAL_COMMUNITY): Payer: Self-pay | Admitting: *Deleted

## 2023-04-03 ENCOUNTER — Ambulatory Visit (HOSPITAL_COMMUNITY): Payer: No Typology Code available for payment source

## 2023-04-03 DIAGNOSIS — J209 Acute bronchitis, unspecified: Secondary | ICD-10-CM

## 2023-04-03 DIAGNOSIS — J0110 Acute frontal sinusitis, unspecified: Secondary | ICD-10-CM

## 2023-04-03 MED ORDER — FLUTICASONE PROPIONATE 50 MCG/ACT NA SUSP: 2.0000 | Freq: Every day | NASAL | 0 refills | Status: DC

## 2023-04-03 MED ORDER — ALBUTEROL SULFATE HFA 108 (90 BASE) MCG/ACT IN AERS: 1.0000 | INHALATION_SPRAY | RESPIRATORY_TRACT | 0 refills | Status: DC | PRN

## 2023-04-03 MED ORDER — AEROCHAMBER MV MISC: 1 refills | Status: DC

## 2023-04-03 MED ORDER — DOXYCYCLINE HYCLATE 100 MG PO CAPS: 100.0000 mg | ORAL_CAPSULE | Freq: Two times a day (BID) | ORAL | 0 refills | Status: AC

## 2023-04-03 MED ORDER — PROMETHAZINE-DM 6.25-15 MG/5ML PO SYRP: 5.0000 mL | ORAL_SOLUTION | Freq: Four times a day (QID) | ORAL | 0 refills | Status: DC | PRN

## 2023-04-03 NOTE — ED Triage Notes (Signed)
Pt states she has a cough x 1 week. She has seen blood in her phlegm twice. She had some Amx left from an old rx she took.

## 2023-04-03 NOTE — Discharge Instructions (Signed)
Your x-ray was negative for pneumonia.  Finish the doxycycline, even if you feel better.  This is for sinus infection.  Atrovent nasal spray, saline nasal irrigation with a NeilMed sinus rinse and distilled water as often as you want to help clear the sinus infection.  Promethazine DM for cough.  2 puffs from your albuterol inhaler using your spacer every 4 hours for 2 days, then every 6 hours for 2 days, then as needed.  You can back off on the albuterol if you start to improve sooner.  Below is a list of primary care practices who are taking new patients for you to follow-up with.  [facility] -multiple locations.  See website at [email or web address]  [facility] internal medicine clinic Ground Floor - [facility], [address] [place], [place], [place] [address] [phone]  [facility] at [facility] [address] [place], [place] [address] [phone]  [facility]- will see patients with no insurance [address] [place], [place] [address] [place], [place] [address] [phone]  [facility] [phone] [address] [place] [address]  [facility]: [address] [place] [address]  [phone]  [facility]: [address] [place] [address]  [phone]  [facility] : [address] [place] [address] [phone]  [facility]: [address] [place] [address] [phone]  [facility]: [address] [place] [address] [phone]  Dr. Oneal Grout [address] [place]  [facility]- will see patients with no insurance. [address] [place], [place] [address] [phone]  Go to www.goodrx.com  or www.[email or web address] to look up your  medications. This will give you a list of where you can find your prescriptions at the most affordable prices. Or ask the pharmacist what the cash price is, or if they have any other discount programs available to help make your medication more affordable. This can be less expensive than what you would pay with insurance.

## 2023-04-03 NOTE — ED Provider Notes (Signed)
HPI  SUBJECTIVE:  Kara Hudson is a 47 y.o. female who presents with cough for over 10 days.  States it was nonproductive but first, but now she has had 2 episodes of blood-streaked mucus, 1 4 days ago, another episode today.  Denies hemoptysis.  She reports left frontal sinus pain and pressure, postnasal drip.  She reports double sickening.  She is unable to sleep at night because of the cough.  No fevers, body aches, chest pain, wheezing, shortness of breath, dyspnea on exertion, nasal congestion, rhinorrhea, facial swelling, upper dental pain.  She had a sore throat, but this has resolved.  No GERD symptoms.  No recent international travel.  She tried an over-the-counter cough medicine and 4 doses of leftover amoxicillin 500 mg.  Took 1 amoxicillin per day for the past 4 days.  States that this helped.  No aggravating factors.  She has a past medical history of GERD, chronic gastritis, pneumonia right middle lobe.  No history of pulmonary disease, smoking.  LMP: First week of May.  Denies possibility of being pregnant.  PCP: None.    Past Medical History:  Diagnosis Date   Medical history non-contributory     Past Surgical History:  Procedure Laterality Date   CESAREAN SECTION     CESAREAN SECTION WITH BILATERAL TUBAL LIGATION Bilateral 02/18/2014   Procedure: CESAREAN SECTION WITH BILATERAL TUBAL LIGATION;  Surgeon: Levie Heritage, DO;  Location: [facility];  Service: Obstetrics;  Laterality: Bilateral;  filshie clips   TUBAL LIGATION      Family History  Problem Relation Age of Onset   Hypertension Mother    Alcohol abuse Mother    Cirrhosis Mother    Hypertension Son     Social History   Tobacco Use   Smoking status: Never   Smokeless tobacco: Never  Vaping Use   Vaping Use: Never used  Substance Use Topics   Alcohol use: No   Drug use: No    No current facility-administered medications for this encounter.  Current Outpatient Medications:    albuterol (VENTOLIN HFA)  108 (90 Base) MCG/ACT inhaler, Inhale 1-2 puffs into the lungs every 4 (four) hours as needed for wheezing or shortness of breath., Disp: 1 each, Rfl: 0   doxycycline (VIBRAMYCIN) 100 MG capsule, Take 1 capsule (100 mg total) by mouth 2 (two) times daily for 10 days., Disp: 20 capsule, Rfl: 0   fluticasone (FLONASE) 50 MCG/ACT nasal spray, Place 2 sprays into both nostrils daily., Disp: 16 g, Rfl: 0   promethazine-dextromethorphan (PROMETHAZINE-DM) 6.25-15 MG/5ML syrup, Take 5 mLs by mouth 4 (four) times daily as needed for cough., Disp: 118 mL, Rfl: 0   Spacer/Aero-Holding Chambers (AEROCHAMBER MV) inhaler, Use as instructed, Disp: 1 each, Rfl: 1   pantoprazole (PROTONIX) 40 MG tablet, Take 1 tablet (40 mg total) by mouth daily. (Patient not taking: Reported on 10/30/2022), Disp: 30 tablet, Rfl: 3  No Known Allergies   ROS  As noted in HPI.   Physical Exam  BP 138/87 (BP Location: Left Arm)   Pulse 83   Temp 98.6 F (37 C) (Oral)   Resp 18   LMP 03/19/2023 (Approximate)   SpO2 98%   Constitutional: Well developed, well nourished, no acute distress Eyes:  EOMI, conjunctiva normal bilaterally HENT: Normocephalic, atraumatic,mucus membranes moist.  Erythematous, swollen turbinates.  Purulent nasal congestion.  No maxillary, frontal sinus tenderness.  Positive cobblestoning.  No postnasal drip. Respiratory: Normal inspiratory effort, wheezing left upper lobe.  No anterior,  lateral chest wall tenderness Cardiovascular: Normal rate, regular rhythm, no murmurs rubs or gallops GI: nondistended skin: No rash, skin intact Musculoskeletal: no deformities Neurologic: Alert & oriented x 3, no focal neuro deficits Psychiatric: Speech and behavior appropriate   ED Course   Medications - No data to display  Orders Placed This Encounter  Procedures   DG Chest 2 View    Standing Status:   Standing    Number of Occurrences:   1    Order Specific Question:   Reason for Exam (SYMPTOM  OR  DIAGNOSIS REQUIRED)    Answer:   Wheezing  left upper lobe, cough for over 10 days r/o PNA    No results found for this or any previous visit (from the past 24 hour(s)). DG Chest 2 View  Result Date: 04/03/2023 CLINICAL DATA:  Wheezing. EXAM: CHEST - 2 VIEW COMPARISON:  Chest radiograph dated 12/27/2019. FINDINGS: The heart size and mediastinal contours are within normal limits. Both lungs are clear. The visualized skeletal structures are unremarkable. IMPRESSION: No active cardiopulmonary disease. Electronically Signed   By: Elgie Collard M.D.   On: 04/03/2023 20:29    ED Clinical Impression  1. Acute non-recurrent frontal sinusitis   2. Acute bronchitis, unspecified organism      ED Assessment/Plan     Will check chest x-ray due to focal lung findings and duration of symptoms.  Concern for pneumonia and left frontal sinusitis.  Plan to send home with doxycycline for 10 days, which will cover both sinus infection and pneumonia, especially since she treated herself with 4 days of amoxicillin.  Will send home with regularly scheduled albuterol inhaler with a spacer for 4 days, then as needed thereafter.  Promethazine DM, Atrovent nasal spray, saline nasal irrigation.  Will provide primary care list for routine care.  Reviewed imaging independently.  No acute cardiopulmonary disease see radiology report for full details.  X-ray negative for pneumonia.  Treating as a sinusitis and bronchitis. plan as above.  Discussed  imaging, MDM, treatment plan, and plan for follow-up with patient. Discussed sn/sx that should prompt return to the ED. patient agrees with plan.   Meds ordered this encounter  Medications   doxycycline (VIBRAMYCIN) 100 MG capsule    Sig: Take 1 capsule (100 mg total) by mouth 2 (two) times daily for 10 days.    Dispense:  20 capsule    Refill:  0   fluticasone (FLONASE) 50 MCG/ACT nasal spray    Sig: Place 2 sprays into both nostrils daily.    Dispense:  16 g     Refill:  0   promethazine-dextromethorphan (PROMETHAZINE-DM) 6.25-15 MG/5ML syrup    Sig: Take 5 mLs by mouth 4 (four) times daily as needed for cough.    Dispense:  118 mL    Refill:  0   Spacer/Aero-Holding Chambers (AEROCHAMBER MV) inhaler    Sig: Use as instructed    Dispense:  1 each    Refill:  1   albuterol (VENTOLIN HFA) 108 (90 Base) MCG/ACT inhaler    Sig: Inhale 1-2 puffs into the lungs every 4 (four) hours as needed for wheezing or shortness of breath.    Dispense:  1 each    Refill:  0      *This clinic note was created using Scientist, clinical (histocompatibility and immunogenetics). Therefore, there may be occasional mistakes despite careful proofreading.  ?    Domenick Gong, MD 04/03/23 2037

## 2023-05-30 ENCOUNTER — Other Ambulatory Visit: Payer: No Typology Code available for payment source

## 2023-05-30 ENCOUNTER — Ambulatory Visit: Payer: No Typology Code available for payment source

## 2023-06-05 NOTE — Progress Notes (Deleted)
Wisewoman initial screening   Interpreter- [name], [employer]   Clinical Measurement: There were no vitals filed for this visit. Fasting Labs Drawn Today, will review with patient when they result.   Medical History: Patient states that she {Blank single:19197::"has","does not have","does not know if she has"} high cholesterol, {Blank single:19197::"has","does not have","does not know if she has"} high blood pressure and she {Blank single:19197::"has","does not have","does not know if she has"} diabetes.  Medications: Patient states that she {Blank single:19197::"does","does not"} take medication to lower cholesterol, blood pressure or blood sugar.  Patient {Blank single:19197::"does","does not"} take an aspirin a day to help prevent a heart attack or stroke. During the past 7 days patient {Blank single:19197::"has","has not","N/A"} taken prescribed medication to lower {Blank single:19197::"cholesterol","blood pressure","blood sugar"} on *** days.   Blood pressure, self measurement: Patient states that she {Blank single:19197::"does","does not","does not have the equipment to"} measure blood pressure from home. She checks her blood pressure {Blank single:19197::"multiple times per day","daily","a few times per week","weekly","monthly","N/A"}. She shares her readings with a health care provider: {Blank single:19197::"yes","no","N/A"}.   Nutrition: Patient states that on average she eats *** cups of fruit and *** cups of vegetables per day. Patient states that she {Blank single:19197::"does","does not"} eat fish at least 2 times per week. Patient eats {Blank single:19197::"less than half","about half","more than half"} servings of whole grains. Patient drinks less than 36 ounces of beverages with added sugar weekly: {Blank single:19197::"yes","no"}. Patient is currently watching sodium or salt intake: {Blank single:19197::"yes","no"}. In the past 7 days patient has consumed drinks containing alcohol on  *** days. On a day that patient consumes drinks containing alcohol on average *** drinks are consumed.      Physical activity: Patient states that she gets *** minutes of moderate and *** minutes of vigorous physical activity each week.  Smoking status: Patient states that she has {Blank single:19197::"has never smoked","is a former smoker, quit 1-12 months ago","is a former smoker, quit 12+ months ago","is a current smoker"} .   Quality of life: Over the past 2 weeks patient states that she had little interest or pleasure in doing things: {Blank single:19197::"not at all","several days","more than half","nearly everyday"}. She has been feeling down, depressed or hopeless:{Blank single:19197::"not at all","several days","more than half","nearly everyday"}.   Social Determinants of Health Assessment:   Computer Use: During the last 12 months patient states that she has used any of the following: desktop/laptop, smart phone or tablet/other portable wireless computer: {Blank single:19197::"yes","no","don't know"}.   Internet Use: During the last 12 months, did you or any member of your household have access to the internet: {Blank single:19197::"Yes, by paying a cell phone company or internet service provider","Yes, without paying a cell phone company or internet service provider","No access to internet in this house,apartment or mobile home"}.   Food Insecurities: During the last 12 months, where there any times when you were worried that you would run out of food because of a lack of money or other resources: {Blank single:19197::"Yes","No"}.   Transportation Barriers: During the last 12 months, have you missed a doctor's appointment because of transportation problems: {Blank single:19197::"Yes","No"}.   Childcare Barriers: If you are currently using childcare services, please identify  the type of services you use. (If not using childcare services, please select "Not applicable"): {Blank  single:19197::"infant (birth to 22 months","toddler (11 to 36 months)","preschool (3 to 5 years)","after school care (K-9th grade)","not applicable"}. During the last 12 months, have you had any barriers to childcare services such as: {Blank single:19197::"cost","availability","location","transportation","hours of operation","other","not applicable"}.  Housing: What is your housing situation today: {Blank single:19197::"I have housing","I have housing, but I am worried about losing my housing","I do not have housing"}.   Intimate Partner Violence: During the last 12 months, how often did your partner physically hurt you: {Blank single:19197::"never","rarely","sometimes","fairly often","frequently","don't want to answer"}. During the last 12 months, how often did your partner insult you or talk down to you: {Blank single:19197::"never","rarely","sometimes","fairly often","frequently","don't want to answer"}.  Medication Adherence: During the last 12 months, did you ever forget to take your medicine: {Blank single:19197::"Yes","No","not applicable"}. During the last 12 months, were you careless ar times about taking your medicine: {Blank single:19197::"Yes","No","not applicable"}. During the last 12 months, when you felt better did you sometimes stop taking your medication: {Blank single:19197::"Yes","No","not applicable"}. During the last 12 months, sometimes if you felt worse when you took your medicine did you stop taking it: {Blank single:19197::"Yes","No","not applicable"}.   Risk reduction and counseling:   Health Coaching:   Goal:    Navigation:  I will notify patient of lab results.  Patient is aware of 2 more health coaching sessions and a follow up.  Time: 25 minutes

## 2023-06-06 ENCOUNTER — Inpatient Hospital Stay: Payer: Self-pay

## 2023-06-06 ENCOUNTER — Other Ambulatory Visit: Payer: Self-pay

## 2023-06-06 DIAGNOSIS — Z0000 Encounter for general adult medical examination without abnormal findings: Secondary | ICD-10-CM

## 2023-07-23 ENCOUNTER — Other Ambulatory Visit: Payer: Self-pay

## 2023-07-23 ENCOUNTER — Inpatient Hospital Stay: Payer: Self-pay

## 2023-07-23 DIAGNOSIS — Z0000 Encounter for general adult medical examination without abnormal findings: Secondary | ICD-10-CM

## 2023-12-06 NOTE — Progress Notes (Signed)
Wisewoman initial screening   Interpreter- [name], [place]   Clinical Measurement:  Vitals:   12/07/23 0847 12/07/23 0911  BP: 110/69 106/72   Fasting Labs Drawn Today, will review with patient when they result.   Medical History: Patient states that she does not know if she has high cholesterol, does not have high blood pressure and she does not have diabetes. Patient states that she does not have history of gestational hypertension, does not have history of pre-eclampsia/eclampsia and she does not have history of gestational diabetes.    Medications: Patient states that she does not take medication to lower cholesterol, blood pressure or blood sugar.  Patient does not take an aspirin a day to help prevent a heart attack or stroke.    Blood pressure, self measurement: Patient states that she does not measure blood pressure from home. She checks her blood pressure N/A. She shares her readings with a health care provider: N/A.   Nutrition: Patient states that on average she eats 2 cups of fruit and 1 cups of vegetables per day. Patient states that she does not eat fish at least 2 times per week. Patient eats less than half servings of whole grains. Patient drinks less than 36 ounces of beverages with added sugar weekly: yes. Patient is currently watching sodium or salt intake: yes. In the past 7 days patient has consumed drinks containing alcohol on 0 days. On a day that patient consumes drinks containing alcohol on average 0 drinks are consumed.      Physical activity: Patient states that she gets 80 minutes of moderate and 0 minutes of vigorous physical activity each week.  Smoking status: Patient states that she has has never smoked .   Quality of life: Over the past 2 weeks patient states that she had little interest or pleasure in doing things: not at all. She has been feeling down, depressed or hopeless:not at all.   Social Determinants of Health Assessment:   Computer Use:  During the last 12 months patient states that she has used any of the following: desktop/laptop, smart phone or tablet/other portable wireless computer: yes.   Internet Use: During the last 12 months, did you or any member of your household have access to the internet: Yes, by paying a cell phone company or internet service provider.   Food Insecurities: During the last 12 months, where there any times when you were worried that you would run out of food because of a lack of money or other resources: No.   Transportation Barriers: During the last 12 months, have you missed a doctor's appointment because of transportation problems: No.   Childcare Barriers: If you are currently using childcare services, please identify  the type of services you use. (If not using childcare services, please select "Not applicable"): not applicable. During the last 12 months, have you had any barriers to childcare services such as: not applicable.   Housing: What is your housing situation today: I have housing.   Intimate Partner Violence: During the last 12 months, how often did your partner physically hurt you: never. During the last 12 months, how often did your partner insult you or talk down to you: never.  Medication Adherence: During the last 12 months, did you ever forget to take your medicine: not applicable. During the last 12 months, were you careless ar times about taking your medicine: not applicable. During the last 12 months, when you felt better did you sometimes stop taking your medication:  not applicable. During the last 12 months, sometimes if you felt worse when you took your medicine did you stop taking it: not applicable.   Risk reduction and counseling:   Health Coaching: Spoke with patient about the daily recommendations for fruits and vegetables. Showed patient what a serving size would look like. Patient does not consume heart healthy fish regularly. Gave recommendations for heart healthy  fish that she can add into weekly diet (salmon, tuna, mackerel, sardines, sea bass or trout). Patient consumes oatmeal and whole wheat bread but not regularly. Gave recommendations for other whole grains that she can try in addition to what she already consumes (brown rice, whole wheat pasta or whole grain cereals). Patient has been walking two days a week for 40 minutes. Patient states that when it is warmer she walks more. Encouraged her to try and get out and walk when the weather permits.   Goal:  Patient will increase vegetable intake to two servings per day. Patient will also increase whole grain intake. Patient will consume one serving of whole grains. Patient will work on reaching this goal over the next month.   Navigation:  I will notify patient of lab results.  Patient is aware of 2 more health coaching sessions and a follow up.  Time: 20 minutes

## 2023-12-07 ENCOUNTER — Inpatient Hospital Stay: Payer: No Typology Code available for payment source | Attending: Obstetrics and Gynecology | Admitting: *Deleted

## 2023-12-07 ENCOUNTER — Other Ambulatory Visit: Payer: Self-pay

## 2023-12-07 VITALS — BP 106/72 | Ht <= 58 in | Wt 119.2 lb

## 2023-12-07 DIAGNOSIS — Z1231 Encounter for screening mammogram for malignant neoplasm of breast: Secondary | ICD-10-CM

## 2023-12-07 DIAGNOSIS — Z0000 Encounter for general adult medical examination without abnormal findings: Secondary | ICD-10-CM

## 2023-12-08 LAB — HEMOGLOBIN A1C
Est. average glucose Bld gHb Est-mCnc: 114 mg/dL
Hgb A1c MFr Bld: 5.6 % (ref 4.8–5.6)

## 2023-12-08 LAB — LIPID PANEL
Chol/HDL Ratio: 2.7 {ratio} (ref 0.0–4.4)
Cholesterol, Total: 143 mg/dL (ref 100–199)
HDL: 53 mg/dL (ref >39)
LDL Chol Calc (NIH): 77 mg/dL (ref 0–99)
Triglycerides: 63 mg/dL (ref 0–149)
VLDL Cholesterol Cal: 13 mg/dL (ref 5–40)

## 2023-12-08 LAB — GLUCOSE, RANDOM: Glucose: 94 mg/dL (ref 70–99)

## 2023-12-24 ENCOUNTER — Telehealth: Payer: Self-pay

## 2023-12-24 NOTE — Telephone Encounter (Signed)
 Health coaching 2   interpreter- Herma Longest, [employer]   Labs- 143 cholesterol, 77 LDL cholesterol, 63 triglycerides, 53 HDL cholesterol, 5.6 hemoglobin A1C, 94 mean plasma glucose. Patient understands and is aware of her lab results.   Goals-  1. Continue practicing heart healthy diet. 2. Work on increasing whole grain and vegetable intake for wellness goal. 3. Increase daily walking when weather gets warmer.   Navigation:  Patient is aware of 1 more health coaching sessions and a follow up.   Time- 10 minutes

## 2024-01-29 ENCOUNTER — Ambulatory Visit
Admission: RE | Admit: 2024-01-29 | Discharge: 2024-01-29 | Disposition: A | Discharge status: Home / Self Care | Payer: No Typology Code available for payment source | Source: Ambulatory Visit | Attending: Obstetrics and Gynecology | Admitting: Obstetrics and Gynecology

## 2024-01-29 ENCOUNTER — Ambulatory Visit: Payer: Self-pay | Admitting: Hematology and Oncology

## 2024-01-29 VITALS — BP 113/62 | Wt 121.0 lb

## 2024-01-29 DIAGNOSIS — Z1231 Encounter for screening mammogram for malignant neoplasm of breast: Secondary | ICD-10-CM

## 2024-01-29 DIAGNOSIS — Z1211 Encounter for screening for malignant neoplasm of colon: Secondary | ICD-10-CM

## 2024-01-29 NOTE — Progress Notes (Signed)
 [name] is a 48 y.o. female who presents to [facility] clinic today with no complaints.    Pap Smear: Pap not smear completed today. Last Pap smear was 07/25/2022 and was normal. Per patient has no history of an abnormal Pap smear. Last Pap smear result is available in Epic.   Physical exam: Breasts Breasts symmetrical. No skin abnormalities bilateral breasts. No nipple retraction bilateral breasts. No nipple discharge bilateral breasts. No lymphadenopathy. No lumps palpated bilateral breasts.       Pelvic/Bimanual Pap is not indicated today    Smoking History: Patient has never smoked and was not referred to quit line.    Patient Navigation: Patient education provided. Access to services provided for patient through [facility] program. [name] interpreter provided. No transportation provided   Colorectal Cancer Screening: Per patient has never had colonoscopy completed No complaints today.    Breast and Cervical Cancer Risk Assessment: Patient does not have family history of breast cancer, known genetic mutations, or radiation treatment to the chest before age 36. Patient does not have history of cervical dysplasia, immunocompromised, or DES exposure in-utero.  Risk Scores as of Encounter on 01/29/2024     Kara Hudson           5-year 0.6%   Lifetime 6.77%            Last calculated by Caprice Red, CMA on 01/29/2024 at  9:09 AM        A: [facility] exam without pap smear No complaints with benign exam.   P: Referred patient to the [facility] for a screening mammogram. Appointment scheduled 01/29/2024.  Ilda Basset A, NP 01/29/2024 9:22 AM

## 2024-01-29 NOTE — Patient Instructions (Signed)
 Taught Kyan Maya-Ruiz about self breast awareness and gave educational materials to take home. Patient did not need a Pap smear today due to last Pap smear was in 01/29/2024 per patient. Let her know [facility] will cover Pap smears every 5 years unless has a history of abnormal Pap smears. Referred patient to the [facility] for screening mammogram. Appointment scheduled for 01/29/2004. Patient aware of appointment and will be there. Let patient know will follow up with her within the next couple weeks with results. Anaysia Maya-Ruiz verbalized understanding.  Pascal Lux, NP 9:24 AM

## 2024-02-03 LAB — SPECIMEN STATUS REPORT

## 2024-02-03 LAB — FECAL OCCULT BLOOD, IMMUNOCHEMICAL: Fecal Occult Bld: NEGATIVE

## 2024-05-08 ENCOUNTER — Ambulatory Visit
Admission: RE | Admit: 2024-05-08 | Discharge: 2024-05-08 | Disposition: A | Discharge status: Home / Self Care | Source: Ambulatory Visit | Attending: Pain Medicine | Admitting: Pain Medicine

## 2024-05-08 ENCOUNTER — Other Ambulatory Visit: Payer: Self-pay | Admitting: Pain Medicine

## 2024-05-08 DIAGNOSIS — R059 Cough, unspecified: Secondary | ICD-10-CM

## 2024-07-07 ENCOUNTER — Ambulatory Visit (INDEPENDENT_AMBULATORY_CARE_PROVIDER_SITE_OTHER): Payer: Self-pay | Admitting: Obstetrics and Gynecology

## 2024-07-07 ENCOUNTER — Other Ambulatory Visit: Payer: Self-pay

## 2024-07-07 VITALS — BP 110/71 | HR 78 | Wt 119.9 lb

## 2024-07-07 DIAGNOSIS — G8929 Other chronic pain: Secondary | ICD-10-CM

## 2024-07-07 DIAGNOSIS — R102 Pelvic and perineal pain: Secondary | ICD-10-CM

## 2024-07-07 DIAGNOSIS — N852 Hypertrophy of uterus: Secondary | ICD-10-CM

## 2024-07-07 NOTE — Progress Notes (Signed)
 NEW GYNECOLOGY PATIENT Patient name: Kara Hudson MRN [id number]  Date of birth: May 28, 1976 Chief Complaint:   GYN Visit      History:  Kara Hudson is a 48 y.o. [phone] being seen today for left sided pain. LLQ pain for a few years. When the pain occurs she will apply pressure and will feels something and when she sleeps feels that it moves. Not a sharp pain but feels like something is there. Nothing that makes it feel better or worse. Feels it more at night when trying to sleep, not daily. Does not matter where she is in her period.  Currently sexually active, no pain with intercourse. No changes in bowel habits or urinary habits. No changes in the period either. Maybe about 2 years of this sensation and feels a little ball and feels something from the outside.   3 cesarean sections + TL All low transverse incisions on the skin       Gynecologic History Patient's last menstrual period was 06/05/2024 (exact date). Contraception: tubal ligation Last Pap:     Component Value Date/Time   DIAGPAP  07/25/2022 0930    - Negative for intraepithelial lesion or malignancy (NILM)   DIAGPAP  07/01/2018 0000    NEGATIVE FOR INTRAEPITHELIAL LESIONS OR MALIGNANCY.   HPVHIGH Negative 07/25/2022 0930   ADEQPAP  07/25/2022 0930    Satisfactory for evaluation; transformation zone component PRESENT.   ADEQPAP  07/01/2018 0000    Satisfactory for evaluation  endocervical/transformation zone component PRESENT.    Last Mammogram: 01/2024 BIRADS 1 Last Colonoscopy: n/a  Obstetric History OB History  Gravida Para Term Preterm AB Living  4 4 4  0 0 4  SAB IAB Ectopic Multiple Live Births  0 0 0 0 4    # Outcome Date GA Lbr Len/2nd Weight Sex Type Anes PTL Lv  4 Term 02/18/14 [redacted]w[redacted]d   F CS-LTranv Spinal  LIV  3 Term 2005 [redacted]w[redacted]d  7 lb (3.175 kg) M CS-LTranv EPI N LIV     Birth Comments: breech c-section  2 Term 2003 [redacted]w[redacted]d  7 lb (3.175 kg) M CS-LTranv EPI N LIV     Birth Comments: Tried  vaginal but did not progess so had c section  1 Term 1999 [redacted]w[redacted]d  6 lb 6 oz (2.892 kg) M Vag-Spont EPI N LIV    Past Medical History:  Diagnosis Date   Medical history non-contributory     Past Surgical History:  Procedure Laterality Date   CESAREAN SECTION     CESAREAN SECTION WITH BILATERAL TUBAL LIGATION Bilateral 02/18/2014   Procedure: CESAREAN SECTION WITH BILATERAL TUBAL LIGATION;  Surgeon: Lang JINNY Peel, DO;  Location: [facility];  Service: Obstetrics;  Laterality: Bilateral;  filshie clips   TUBAL LIGATION      Current Outpatient Medications on File Prior to Visit  Medication Sig Dispense Refill   calcium-vitamin D (OSCAL WITH D) 500-5 MG-MCG tablet Take 1 tablet by mouth.     vitamin B-12 (CYANOCOBALAMIN) 100 MCG tablet Take 100 mcg by mouth daily.     No current facility-administered medications on file prior to visit.    No Known Allergies  Social History:  reports that she has never smoked. She has never used smokeless tobacco. She reports that she does not drink alcohol and does not use drugs.  Family History  Problem Relation Age of Onset   Hypertension Mother    Alcohol abuse Mother    Cirrhosis Mother    Hypertension Son  The following portions of the patient's history were reviewed and updated as appropriate: allergies, current medications, past family history, past medical history, past social history, past surgical history and problem list.  Review of Systems Pertinent items noted in HPI and remainder of comprehensive ROS otherwise negative.  Physical Exam:  BP 110/71   Pulse 78   Wt 119 lb 14.4 oz (54.4 kg)   LMP 06/05/2024 (Exact Date)   BMI 25.95 kg/m  Physical Exam Vitals and nursing note reviewed. Exam conducted with a chaperone present.  Constitutional:      Appearance: Normal appearance.  Pulmonary:     Effort: Pulmonary effort is normal.  Abdominal:     Palpations: Abdomen is soft.     Comments: LLQ tenderness with slight  fullness Well healed low transverse incisions  Tender left levator ani and ischicoccygeous muscles Mobile uterus with lateral fullness, possible lateral fibroid Nontender right pelvic floor muscles  Genitourinary:    General: Normal vulva.     Exam position: Lithotomy position.  Neurological:     Mental Status: She is alert.        Assessment and Plan:   1. Enlarged uterus (Primary) 2. Chronic pelvic pain in female Suspect enlarged uterus with fibroids, likely subserosal vs adnexal mass/cyst. Pelvic US  ordered for better evaluation. Suspect pain from movement of intraabdominal mass as well as pelvic myalgia inn response to mass.  - US  PELVIC COMPLETE WITH TRANSVAGINAL; Future   Routine preventative health maintenance measures emphasized. Please refer to After Visit Summary for other counseling recommendations.   Follow-up: No follow-ups on file.      Carter Quarry, MD Obstetrician & Gynecologist, Faculty Practice [facility] [employer], [facility]

## 2024-07-15 ENCOUNTER — Ambulatory Visit (HOSPITAL_COMMUNITY): Payer: Self-pay

## 2024-08-04 ENCOUNTER — Ambulatory Visit: Payer: Self-pay | Admitting: Obstetrics and Gynecology

## 2024-11-02 ENCOUNTER — Ambulatory Visit (HOSPITAL_COMMUNITY)
Admission: EM | Admit: 2024-11-02 | Discharge: 2024-11-02 | Disposition: A | Discharge status: Home / Self Care | Payer: Self-pay | Attending: Neurology | Admitting: Neurology

## 2024-11-02 ENCOUNTER — Encounter (HOSPITAL_COMMUNITY): Payer: Self-pay

## 2024-11-02 DIAGNOSIS — J111 Influenza due to unidentified influenza virus with other respiratory manifestations: Secondary | ICD-10-CM

## 2024-11-02 DIAGNOSIS — R52 Pain, unspecified: Secondary | ICD-10-CM

## 2024-11-02 LAB — POC COVID19/FLU A&B COMBO
Covid Antigen, POC: NEGATIVE
Influenza A Antigen, POC: POSITIVE — AB
Influenza B Antigen, POC: NEGATIVE

## 2024-11-02 MED ORDER — CETIRIZINE HCL 10 MG PO TABS: 10.0000 mg | ORAL_TABLET | Freq: Every day | ORAL | 0 refills | Status: AC

## 2024-11-02 MED ORDER — IBUPROFEN 400 MG PO TABS: 400.0000 mg | ORAL_TABLET | Freq: Four times a day (QID) | ORAL | 0 refills | Status: AC | PRN

## 2024-11-02 MED ORDER — OSELTAMIVIR PHOSPHATE 75 MG PO CAPS: 75.0000 mg | ORAL_CAPSULE | Freq: Two times a day (BID) | ORAL | 0 refills | Status: AC

## 2024-11-02 MED ORDER — FLUTICASONE PROPIONATE 50 MCG/ACT NA SUSP: 1.0000 | Freq: Every day | NASAL | 0 refills | Status: AC

## 2024-11-02 MED ORDER — PROMETHAZINE-DM 6.25-15 MG/5ML PO SYRP: 5.0000 mL | ORAL_SOLUTION | Freq: Four times a day (QID) | ORAL | 0 refills | Status: AC | PRN

## 2024-11-02 NOTE — Discharge Instructions (Addendum)
 Tiene gripe. Tome Tamiflu  cada 12 horas durante los prximos 5 das para aliviar los sntomas y automotive engineer que el virus se multiplique en su cuerpo. Tome ibuprofeno o paracetamol segn sea necesario para la fiebre y los dolores corporales. Tome Mucinex segn sea necesario para la congestin nasal. Tome Zofran  segn sea necesario para las nuseas y los vmitos. Tome el jarabe para la tos Prometazina DM para aliviar la tos por la noche y poder dormir. No conduzca, no consuma alcohol ni vaya a trabajar mientras toma este medicamento, ya que puede causar somnolencia. Tmelo solo por la noche.  Si presenta dolor en el pecho, dificultad para respirar, incapacidad para retener alimentos o lquidos sin vomitar, fiebre que no cede con paracetamol o ibuprofeno, o cualquier otro sntoma grave, acuda a la sala de emergencias para una evaluacin adicional. Regrese a la clnica de atencin de urgencias si es necesario; de lo contrario, consulte con su mdico de information systems manager.

## 2024-11-02 NOTE — ED Provider Notes (Signed)
 " [facility]    CSN: [id number] Arrival date & time: 11/02/24  0820      History   Chief Complaint Chief Complaint  Patient presents with   Chills    HPI Kara Hudson is a 48 y.o. female.   Presenting with ear pain, chills, body aches, fever, and sore throat since Friday.  Endorses intermittent nonproductive cough and generalized headache as well.  She has taken tylenol . She has taken alkaseltzer plus as well.  A coworker was sick and she did spend time around them.  She is not currently taking any antihistamines.  The history is provided by the patient. The history is limited by a language barrier. A language interpreter was used.    Past Medical History:  Diagnosis Date   Medical history non-contributory     Patient Active Problem List   Diagnosis Date Noted   History of bilateral tubal ligation 05/27/2018   Chronic gastritis without bleeding 05/27/2018   Gastroesophageal reflux disease 05/27/2018   Pneumonia of right middle lobe due to infectious organism 05/27/2018   Chronic left-sided thoracic back pain 05/27/2018   Language barrier, speaks Spanish only 07/29/2013    Past Surgical History:  Procedure Laterality Date   CESAREAN SECTION     CESAREAN SECTION WITH BILATERAL TUBAL LIGATION Bilateral 02/18/2014   Procedure: CESAREAN SECTION WITH BILATERAL TUBAL LIGATION;  Surgeon: Lang JINNY Peel, DO;  Location: [facility];  Service: Obstetrics;  Laterality: Bilateral;  filshie clips   TUBAL LIGATION      OB History     Gravida  4   Para  4   Term  4   Preterm  0   AB  0   Living  4      SAB  0   IAB  0   Ectopic  0   Multiple  0   Live Births  4            Home Medications    Prior to Admission medications  Not on File    Family History Family History  Problem Relation Age of Onset   Hypertension Mother    Alcohol abuse Mother    Cirrhosis Mother    Hypertension Son     Social History Social  History[1]   Allergies   Patient has no known allergies.   Review of Systems Review of Systems   Physical Exam Triage Vital Signs ED Triage Vitals  Encounter Vitals Group     BP 11/02/24 0910 111/73     Girls Systolic BP Percentile --      Girls Diastolic BP Percentile --      Boys Systolic BP Percentile --      Boys Diastolic BP Percentile --      Pulse Rate 11/02/24 0910 (!) 125     Resp 11/02/24 0910 16     Temp 11/02/24 0910 99.3 F (37.4 C)     Temp Source 11/02/24 0910 Oral     SpO2 11/02/24 0910 95 %     Weight --      Height --      Head Circumference --      Peak Flow --      Pain Score 11/02/24 0907 7     Pain Loc --      Pain Education --      Exclude from Growth Chart --    No data found.  Updated Vital Signs BP 111/73 (BP Location: Left Arm)  Pulse (!) 125   Temp 99.3 F (37.4 C) (Oral)   Resp 16   LMP 10/15/2024 (Exact Date)   SpO2 95%   Visual Acuity Right Eye Distance:   Left Eye Distance:   Bilateral Distance:    Right Eye Near:   Left Eye Near:    Bilateral Near:     Physical Exam Vitals and nursing note reviewed.  Constitutional:      Appearance: She is well-developed. She is ill-appearing.  HENT:     Head: Normocephalic and atraumatic.  Eyes:     Conjunctiva/sclera: Conjunctivae normal.  Cardiovascular:     Rate and Rhythm: Normal rate and regular rhythm.     Heart sounds: No murmur heard. Pulmonary:     Effort: Pulmonary effort is normal. No respiratory distress.     Breath sounds: Normal breath sounds.  Abdominal:     Palpations: Abdomen is soft.     Tenderness: There is no abdominal tenderness.  Musculoskeletal:        General: No swelling.     Cervical back: Neck supple.  Skin:    General: Skin is warm and dry.     Capillary Refill: Capillary refill takes less than 2 seconds.  Neurological:     Mental Status: She is alert.  Psychiatric:        Mood and Affect: Mood normal.      UC Treatments / Results   Labs (all labs ordered are listed, but only abnormal results are displayed) Labs Reviewed  POC COVID19/FLU A&B COMBO    EKG   Radiology No results found.  Procedures Procedures (including critical care time)  Medications Ordered in UC Medications - No data to display  Initial Impression / Assessment and Plan / UC Course  I have reviewed the triage vital signs and the nursing notes.  Pertinent labs & imaging results that were available during my care of the patient were reviewed by me and considered in my medical decision making (see chart for details).   Suspect viral URI, viral syndrome.  Strep/viral testing:   Physical exam findings reassuring, vital signs hemodynamically stable, and lungs clear, therefore deferred imaging of the chest.  Advised supportive care/prescriptions for symptomatic relief as outlined in AVS.        Final Clinical Impressions(s) / UC Diagnoses   Final diagnoses:  Body aches     Discharge Instructions      Tiene una enfermedad viral que mejorar por s sola con descanso, lquidos y medicamentos para [employer] sntomas.  El paracetamol, el ibuprofeno, la guaifenesina (Mucinex simple) y los aerosoles nasales de solucin salina pueden ayudar a paramedic los sntomas. Flonase  y Zyrtec  ayudarn con la acumulacin de lquido detrs de los odos. Las cpsulas de Tessalon ayudarn a suprimir la tos.  Dos cucharaditas de miel en una taza de agua tibia cada 4 a 6 horas pueden ayudar con el dolor de garganta. Un humidificador en la habitacin por la noche puede ayudar a aliviar la tos (lmpielo bien a diario).  Tome el jarabe para la tos Promethazine  DM para aliviar la tos por la noche y poder dormir. No conduzca, no consuma alcohol ni vaya a trabajar mientras toma este medicamento, ya que puede causar somnolencia. Tmelo solo por la noche.  Si presenta dolor en el pecho, dificultad para respirar, incapacidad para retener alimentos o lquidos sin  vomitar, fiebre que no cede con paracetamol o ibuprofeno, o cualquier otro sntoma grave, acuda a la sala de emergencias para  una evaluacin adicional. Regrese a la clnica de atencin de urgencia si es necesario; de lo contrario, consulte con su mdico de information systems manager.     ED Prescriptions   None    PDMP not reviewed this encounter.     [1]  Social History Tobacco Use   Smoking status: Never   Smokeless tobacco: Never  Vaping Use   Vaping status: Never Used  Substance Use Topics   Alcohol use: No   Drug use: No     Remi Pippin, NP 11/02/24 1136  "

## 2024-11-02 NOTE — ED Triage Notes (Signed)
 Pt has c/o chills, ear ache,fever, body aches, sore throat that started Friday into yesterday. Pt has taken tylenol  with little relief.  Interpreter: S[place] [phone]
